# Patient Record
Sex: Female | Born: 1940 | Race: White | Hispanic: No | Smoking: Former smoker
Health system: Southern US, Community
[De-identification: ages and names within clinical notes are randomized; demographics above are authoritative.]

## PROBLEM LIST (undated history)

## (undated) DIAGNOSIS — M545 Low back pain, unspecified: Secondary | ICD-10-CM

## (undated) DIAGNOSIS — E785 Hyperlipidemia, unspecified: Secondary | ICD-10-CM

## (undated) DIAGNOSIS — C801 Malignant (primary) neoplasm, unspecified: Secondary | ICD-10-CM

## (undated) DIAGNOSIS — I10 Essential (primary) hypertension: Secondary | ICD-10-CM

## (undated) DIAGNOSIS — E039 Hypothyroidism, unspecified: Secondary | ICD-10-CM

## (undated) DIAGNOSIS — M1990 Unspecified osteoarthritis, unspecified site: Secondary | ICD-10-CM

## (undated) DIAGNOSIS — G8929 Other chronic pain: Secondary | ICD-10-CM

## (undated) DIAGNOSIS — G459 Transient cerebral ischemic attack, unspecified: Secondary | ICD-10-CM

## (undated) DIAGNOSIS — F341 Dysthymic disorder: Secondary | ICD-10-CM

## (undated) HISTORY — PX: CATARACT EXTRACTION W/ INTRAOCULAR LENS  IMPLANT, BILATERAL: SHX1307

## (undated) HISTORY — DX: Essential (primary) hypertension: I10

## (undated) HISTORY — PX: APPENDECTOMY: SHX54

## (undated) HISTORY — DX: Hyperlipidemia, unspecified: E78.5

## (undated) HISTORY — PX: LEG SURGERY: SHX1003

## (undated) HISTORY — PX: COLOSTOMY: SHX63

## (undated) HISTORY — PX: COLOSTOMY REVERSAL: SHX5782

## (undated) HISTORY — PX: COLON SURGERY: SHX602

## (undated) HISTORY — PX: ABDOMINAL HYSTERECTOMY: SHX81

---

## 2003-04-06 ENCOUNTER — Ambulatory Visit (HOSPITAL_COMMUNITY): Admission: RE | Admit: 2003-04-06 | Discharge: 2003-04-06 | Payer: Self-pay | Admitting: Internal Medicine

## 2003-04-06 ENCOUNTER — Encounter: Payer: Self-pay | Admitting: Internal Medicine

## 2003-05-31 ENCOUNTER — Encounter: Payer: Self-pay | Admitting: Internal Medicine

## 2003-05-31 ENCOUNTER — Ambulatory Visit (HOSPITAL_COMMUNITY): Admission: RE | Admit: 2003-05-31 | Discharge: 2003-05-31 | Payer: Self-pay | Admitting: Internal Medicine

## 2003-06-21 ENCOUNTER — Encounter (HOSPITAL_BASED_OUTPATIENT_CLINIC_OR_DEPARTMENT_OTHER): Payer: Self-pay | Admitting: Internal Medicine

## 2003-06-21 ENCOUNTER — Ambulatory Visit (HOSPITAL_COMMUNITY): Admission: RE | Admit: 2003-06-21 | Discharge: 2003-06-21 | Payer: Self-pay | Admitting: Internal Medicine

## 2004-10-31 ENCOUNTER — Inpatient Hospital Stay: Payer: Self-pay | Admitting: Internal Medicine

## 2005-09-28 ENCOUNTER — Inpatient Hospital Stay: Payer: Self-pay | Admitting: Internal Medicine

## 2005-09-28 ENCOUNTER — Other Ambulatory Visit: Payer: Self-pay

## 2007-02-25 ENCOUNTER — Ambulatory Visit: Payer: Self-pay | Admitting: Ophthalmology

## 2007-03-01 ENCOUNTER — Ambulatory Visit: Payer: Self-pay | Admitting: Ophthalmology

## 2008-10-16 ENCOUNTER — Ambulatory Visit: Payer: Self-pay | Admitting: Pain Medicine

## 2009-08-28 ENCOUNTER — Inpatient Hospital Stay: Payer: Self-pay | Admitting: Internal Medicine

## 2010-01-17 ENCOUNTER — Ambulatory Visit: Payer: Self-pay | Admitting: Internal Medicine

## 2010-01-31 ENCOUNTER — Ambulatory Visit: Payer: Self-pay | Admitting: Internal Medicine

## 2010-02-21 ENCOUNTER — Ambulatory Visit: Payer: Self-pay | Admitting: Internal Medicine

## 2010-03-13 ENCOUNTER — Inpatient Hospital Stay: Payer: Self-pay | Admitting: Internal Medicine

## 2010-04-21 ENCOUNTER — Emergency Department: Payer: Self-pay | Admitting: Emergency Medicine

## 2010-05-03 ENCOUNTER — Ambulatory Visit: Payer: Self-pay | Admitting: Internal Medicine

## 2010-05-06 ENCOUNTER — Ambulatory Visit: Payer: Self-pay | Admitting: Internal Medicine

## 2010-05-10 ENCOUNTER — Ambulatory Visit: Payer: Self-pay | Admitting: Internal Medicine

## 2010-05-17 ENCOUNTER — Ambulatory Visit: Payer: Self-pay | Admitting: Internal Medicine

## 2010-05-23 ENCOUNTER — Ambulatory Visit: Payer: Self-pay | Admitting: Internal Medicine

## 2010-07-05 ENCOUNTER — Ambulatory Visit: Payer: Self-pay | Admitting: Ophthalmology

## 2010-07-18 ENCOUNTER — Ambulatory Visit: Payer: Self-pay | Admitting: Cardiology

## 2010-07-22 ENCOUNTER — Ambulatory Visit: Payer: Self-pay | Admitting: Ophthalmology

## 2010-11-19 ENCOUNTER — Ambulatory Visit: Payer: Self-pay | Admitting: Ophthalmology

## 2011-05-14 ENCOUNTER — Inpatient Hospital Stay: Payer: Self-pay | Admitting: Orthopedic Surgery

## 2011-05-23 ENCOUNTER — Emergency Department: Payer: Self-pay | Admitting: Unknown Physician Specialty

## 2011-07-29 ENCOUNTER — Telehealth: Payer: Self-pay | Admitting: Internal Medicine
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
## 2011-07-29 NOTE — Telephone Encounter (Signed)
Fine to fax over this prescription. May need to hand write it.

## 2011-07-30 ENCOUNTER — Other Ambulatory Visit: Payer: Self-pay | Admitting: Internal Medicine

## 2011-07-31 ENCOUNTER — Other Ambulatory Visit: Payer: Self-pay | Admitting: Internal Medicine

## 2011-07-31 MED ORDER — SIMVASTATIN 40 MG PO TABS: 40.0000 mg | ORAL_TABLET | Freq: Every day | ORAL | Status: DC

## 2011-07-31 NOTE — Telephone Encounter (Signed)
Jeanette Hester @ [facility] pt needs a transfer bench for transferring from power wheel chair to bed and from power wheel chair to tub   Please advise david or fax order [phone]

## 2011-07-31 NOTE — Telephone Encounter (Signed)
Can you please take care of this?

## 2011-08-01 MED ORDER — CYCLOBENZAPRINE HCL 5 MG PO TABS: 5.0000 mg | ORAL_TABLET | Freq: Three times a day (TID) | ORAL | Status: DC | PRN

## 2011-08-01 NOTE — Telephone Encounter (Signed)
Faxed over Rx

## 2011-08-04 NOTE — Telephone Encounter (Signed)
Opened in error

## 2011-08-05 NOTE — Telephone Encounter (Signed)
Pam, Can you fax over any additional Rx they need? Thanks

## 2011-08-05 NOTE — Telephone Encounter (Signed)
Telephone Encounter     Jeanette Hester @ [facility] pt needs a transfer bench for transferring from power wheel chair to bed and from power wheel chair to tub  Please advise david or fax order [phone]

## 2011-08-08 MED ORDER — TRANSFER BENCH MISC: Status: AC

## 2011-08-08 NOTE — Telephone Encounter (Signed)
RX sent in

## 2011-08-21 ENCOUNTER — Ambulatory Visit (INDEPENDENT_AMBULATORY_CARE_PROVIDER_SITE_OTHER): Payer: Medicare Other | Admitting: Internal Medicine

## 2011-08-21 ENCOUNTER — Encounter: Payer: Self-pay | Admitting: Internal Medicine

## 2011-08-21 DIAGNOSIS — I10 Essential (primary) hypertension: Secondary | ICD-10-CM

## 2011-08-21 DIAGNOSIS — E039 Hypothyroidism, unspecified: Secondary | ICD-10-CM | POA: Insufficient documentation

## 2011-08-21 DIAGNOSIS — G8921 Chronic pain due to trauma: Secondary | ICD-10-CM

## 2011-08-21 DIAGNOSIS — E119 Type 2 diabetes mellitus without complications: Secondary | ICD-10-CM | POA: Insufficient documentation

## 2011-08-21 DIAGNOSIS — E785 Hyperlipidemia, unspecified: Secondary | ICD-10-CM | POA: Insufficient documentation

## 2011-08-21 DIAGNOSIS — G4730 Sleep apnea, unspecified: Secondary | ICD-10-CM

## 2011-08-21 DIAGNOSIS — Z23 Encounter for immunization: Secondary | ICD-10-CM

## 2011-08-21 MED ORDER — INSULIN GLARGINE 100 UNIT/ML ~~LOC~~ SOLN: 63.0000 [IU] | Freq: Every day | SUBCUTANEOUS | Status: DC

## 2011-08-21 MED ORDER — FENTANYL 50 MCG/HR TD PT72: 1.0000 | MEDICATED_PATCH | TRANSDERMAL | Status: AC

## 2011-08-21 MED ORDER — OXYCODONE HCL 10 MG PO TABS: 1.0000 | ORAL_TABLET | ORAL | Status: AC | PRN
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
## 2011-08-21 NOTE — Patient Instructions (Addendum)
We will check labs today.  Try to cut back on Oxycodone use to only as needed, no more than 1-3 times per day. Follow up in 1 month.

## 2011-08-22 LAB — COMPREHENSIVE METABOLIC PANEL
ALT: 21 U/L (ref 0–35)
Albumin: 3.6 g/dL (ref 3.5–5.2)
CO2: 31 mEq/L (ref 19–32)
Calcium: 8.7 mg/dL (ref 8.4–10.5)
Chloride: 97 mEq/L (ref 96–112)
Creatinine, Ser: 1.5 mg/dL — ABNORMAL HIGH (ref 0.4–1.2)
GFR: 37.34 mL/min — ABNORMAL LOW (ref >60.00)
Potassium: 4.2 mEq/L (ref 3.5–5.1)
Total Protein: 6.7 g/dL (ref 6.0–8.3)

## 2011-08-22 LAB — CBC WITH DIFFERENTIAL/PLATELET
Basophils Absolute: 0 10*3/uL (ref 0.0–0.1)
Eosinophils Absolute: 0 10*3/uL (ref 0.0–0.7)
Hemoglobin: 10.9 g/dL — ABNORMAL LOW (ref 12.0–15.0)
Lymphocytes Relative: 25.2 % (ref 12.0–46.0)
Monocytes Relative: 7.2 % (ref 3.0–12.0)
Neutrophils Relative %: 67.3 % (ref 43.0–77.0)
Platelets: 187 10*3/uL (ref 150.0–400.0)
RDW: 14.9 % — ABNORMAL HIGH (ref 11.5–14.6)

## 2011-08-22 LAB — TSH: TSH: 1.3 u[IU]/mL (ref 0.35–5.50)

## 2011-08-22 LAB — HEMOGLOBIN A1C: Hgb A1c MFr Bld: 7.5 % — ABNORMAL HIGH (ref 4.6–6.5)

## 2011-08-27 ENCOUNTER — Telehealth: Payer: Self-pay | Admitting: Internal Medicine

## 2011-08-27 NOTE — Telephone Encounter (Signed)
She had a decline in her kidney function on labs, so probably not safe to use NSAIDS on a regular basis. She can add tylenol as needed (max 2gm per day) and we can try a long acting narcotic if necessary.  She seemed a little drowsy at her visit, though, so would prefer to see her back prior to starting long acting narcotic.

## 2011-08-27 NOTE — Telephone Encounter (Signed)
Patient is in pain all over her body. Does not know what is wrong. She had a refill on pain medication on Nov.1 ,pain medication not working. She would like a call to advise her as to what is going on with her body.

## 2011-08-27 NOTE — Telephone Encounter (Signed)
Spoke with pt - She has gotten no relief from oxycodone. She had one pill of naprelan 750 mg which helped significantly with her pain. Can she have RX for this? Or something similar? Or try OTC aleve?

## 2011-08-28 ENCOUNTER — Observation Stay: Payer: Self-pay | Admitting: Internal Medicine

## 2011-08-28 NOTE — Telephone Encounter (Signed)
No answer, no VM

## 2011-08-29 NOTE — Telephone Encounter (Signed)
Patient went to the ER and kept overnight, she had diarrhea and low blood sugar. She is feeling better now and will keep apt on 11/19 for f/u.

## 2011-09-08 ENCOUNTER — Ambulatory Visit: Payer: Medicare Other | Admitting: Internal Medicine

## 2011-09-10 ENCOUNTER — Telehealth: Payer: Self-pay | Admitting: *Deleted

## 2011-09-10 NOTE — Telephone Encounter (Signed)
Onalee Hua with [facility] is calling in regards to an order he was supposed to receive for patient to restart her cpap. He says that this was supposed to be done at patient's last visit on 08-21-11 and is asking if he can get this order faxed to [phone]. He is aware that you are out of the office until Monday.

## 2011-09-14 NOTE — Telephone Encounter (Signed)
Fine to fax order to renew CPAP.

## 2011-09-16 ENCOUNTER — Other Ambulatory Visit: Payer: Self-pay | Admitting: Internal Medicine

## 2011-09-16 ENCOUNTER — Encounter: Payer: Self-pay | Admitting: *Deleted

## 2011-09-16 NOTE — Telephone Encounter (Signed)
Order faxed to [facility].

## 2011-09-17 ENCOUNTER — Ambulatory Visit (INDEPENDENT_AMBULATORY_CARE_PROVIDER_SITE_OTHER): Payer: Medicare Other | Admitting: Internal Medicine

## 2011-09-17 ENCOUNTER — Encounter: Payer: Self-pay | Admitting: Internal Medicine

## 2011-09-17 DIAGNOSIS — R609 Edema, unspecified: Secondary | ICD-10-CM

## 2011-09-17 DIAGNOSIS — A6000 Herpesviral infection of urogenital system, unspecified: Secondary | ICD-10-CM

## 2011-09-17 DIAGNOSIS — R300 Dysuria: Secondary | ICD-10-CM

## 2011-09-17 DIAGNOSIS — G8921 Chronic pain due to trauma: Secondary | ICD-10-CM

## 2011-09-17 DIAGNOSIS — E119 Type 2 diabetes mellitus without complications: Secondary | ICD-10-CM

## 2011-09-17 MED ORDER — FUROSEMIDE 80 MG PO TABS: 80.0000 mg | ORAL_TABLET | Freq: Every day | ORAL | Status: DC

## 2011-09-17 MED ORDER — CEPHALEXIN 250 MG PO CAPS: 500.0000 mg | ORAL_CAPSULE | Freq: Two times a day (BID) | ORAL | Status: AC

## 2011-09-17 MED ORDER — INSULIN GLARGINE 100 UNIT/ML ~~LOC~~ SOLN: 60.0000 [IU] | Freq: Every day | SUBCUTANEOUS | Status: DC

## 2011-09-17 MED ORDER — VALACYCLOVIR HCL 500 MG PO TABS: 500.0000 mg | ORAL_TABLET | Freq: Every day | ORAL | Status: DC

## 2011-09-17 NOTE — Progress Notes (Signed)
Subjective:    Patient ID: Jeanette Hester, female    DOB: [birth year], 69 y.o.   MRN: [id number]  HPI 70YO female with h/o DM and recent fracture of left femur presents for follow up. Was recently hospitalized overnight in the ED for dehydration.  In regards to DM, still having some low BG. Notes that she often does not appreciate symptoms, just blacks out. Has called EMS 4 times in last 2 weeks.  Still taking Lantus 63 units at bedtime, which was dose she was discharged from rehab on.  Record of BG shows sugars 90 to 300 mostly.    Notes that leg pain is controlled with fentanyl and oxycodone. Notes that wound left foot scheduled for skin grafting next week.  Pt notes that she was treated for UTI after ED stay. Was treated with keflex.  Notes persistent dysuria after completing Keflex. No fever, chills, flank pain.    Outpatient Encounter Prescriptions as of 09/17/2011  Medication Sig Dispense Refill  . ALPRAZolam (XANAX) 0.5 MG tablet Take 0.5 mg by mouth at bedtime as needed.        Marland Kitchen amLODipine (NORVASC) 5 MG tablet Take 5 mg by mouth daily.        . Choline Fenofibrate 135 MG capsule Take 135 mg by mouth daily.        . clopidogrel (PLAVIX) 75 MG tablet Take 75 mg by mouth daily.        . cyclobenzaprine (FLEXERIL) 5 MG tablet Take 1 tablet (5 mg total) by mouth every 8 (eight) hours as needed for muscle spasms.  30 tablet  1  . fenofibrate (TRICOR) 145 MG tablet Take 145 mg by mouth daily.        . fentaNYL (DURAGESIC - DOSED MCG/HR) 50 MCG/HR Place 1 patch (50 mcg total) onto the skin every 3 (three) days.  5 patch  0  . furosemide (LASIX) 80 MG tablet Take 1 tablet (80 mg total) by mouth daily. 1/2 tablet daily   30 tablet  0  . gabapentin (NEURONTIN) 300 MG capsule Take 300 mg by mouth 4 (four) times daily.        . insulin glargine (LANTUS) 100 UNIT/ML injection Inject 60 Units into the skin at bedtime.  10 mL  12  . levothyroxine (SYNTHROID, LEVOTHROID) 112 MCG tablet Take 112 mcg by  mouth daily.        . metFORMIN (GLUCOPHAGE) 500 MG tablet Take 500 mg by mouth daily as needed.        . mirtazapine (REMERON) 15 MG tablet Take 15 mg by mouth at bedtime.        . Misc. Devices (TRANSFER BENCH) MISC As directed DX: DJD, pelvic fracture  1 each  0  . Oxycodone HCl 10 MG TABS Take 1 tablet (10 mg total) by mouth every 4 (four) hours as needed.  90 each  0  . pantoprazole (PROTONIX) 40 MG tablet Take 40 mg by mouth daily.        . potassium chloride (KLOR-CON) 10 MEQ CR tablet Take 10 mEq by mouth daily.        . simvastatin (ZOCOR) 40 MG tablet Take 1 tablet (40 mg total) by mouth at bedtime.  30 tablet  3  . valACYclovir (VALTREX) 500 MG tablet Take 500 mg by mouth daily.        Marland Kitchen venlafaxine (EFFEXOR-XR) 150 MG 24 hr capsule Take 150 mg by mouth daily.        Marland Kitchen  zolpidem (AMBIEN) 10 MG tablet Take 10 mg by mouth at bedtime as needed.          Review of Systems  Constitutional: Negative for fever, chills, appetite change, fatigue and unexpected weight change.  HENT: Negative for ear pain, congestion, sore throat, trouble swallowing, neck pain, voice change and sinus pressure.   Eyes: Negative for visual disturbance.  Respiratory: Negative for cough, shortness of breath, wheezing and stridor.   Cardiovascular: Negative for chest pain, palpitations and leg swelling.  Gastrointestinal: Negative for nausea, vomiting, abdominal pain, diarrhea, constipation, blood in stool, abdominal distention and anal bleeding.  Genitourinary: Negative for dysuria and flank pain.  Musculoskeletal: Positive for myalgias, back pain and arthralgias. Negative for gait problem.  Skin: Negative for color change and rash.  Neurological: Negative for dizziness and headaches.  Hematological: Negative for adenopathy. Does not bruise/bleed easily.  Psychiatric/Behavioral: Negative for suicidal ideas, sleep disturbance and dysphoric mood. The patient is not nervous/anxious.    BP 138/60  Pulse 62  Temp  98.4 F (36.9 C)  Resp 16  SpO2 99%     Objective:   Physical Exam  Constitutional: She is oriented to person, place, and time. She appears well-developed and well-nourished. No distress.  HENT:  Head: Normocephalic and atraumatic.  Right Ear: External ear normal.  Left Ear: External ear normal.  Nose: Nose normal.  Mouth/Throat: Oropharynx is clear and moist. No oropharyngeal exudate.  Eyes: Conjunctivae are normal. Pupils are equal, round, and reactive to light. Right eye exhibits no discharge. Left eye exhibits no discharge. No scleral icterus.  Neck: Normal range of motion. Neck supple. No tracheal deviation present. No thyromegaly present.  Cardiovascular: Normal rate, regular rhythm, normal heart sounds and intact distal pulses.  Exam reveals no gallop and no friction rub.   No murmur heard. Pulmonary/Chest: Effort normal and breath sounds normal. No respiratory distress. She has no wheezes. She has no rales. She exhibits no tenderness.  Musculoskeletal: Normal range of motion. She exhibits no edema and no tenderness.  Lymphadenopathy:    She has no cervical adenopathy.  Neurological: She is alert and oriented to person, place, and time. No cranial nerve deficit. She exhibits normal muscle tone. Coordination normal.  Skin: Skin is warm and dry. No rash noted. She is not diaphoretic. No erythema. No pallor.  Psychiatric: She has a normal mood and affect. Her behavior is normal. Judgment and thought content normal.          Assessment & Plan:  1. Diabetes mellitus - BG low, with frequent blackouts. Has required EMS x 4 since last visit. I would like to significantly reduce her dose of Lantus, but she is reluctant to do this because of hyperglycemia in past. Will decrease to 60units daily. If any further low BG<80, she will call and we will reduce further.  I think she would benefit from care in assisted living. She would like to think about this over next few weeks. Follow up in  6 weeks.  2. Chronic pain after traumatic injury - Controlled with current medications. No changes made today.  3. UTI - Pt was treated with Keflex after ED visit. Still having some dysuria. Will repeat UA and culture. She is unable to give specimen today, so will have her bring one from home. Will continue Keflex for another week.

## 2011-09-18 ENCOUNTER — Other Ambulatory Visit: Payer: Self-pay | Admitting: *Deleted

## 2011-09-18 DIAGNOSIS — R609 Edema, unspecified: Secondary | ICD-10-CM

## 2011-09-19 ENCOUNTER — Other Ambulatory Visit: Payer: Self-pay | Admitting: *Deleted

## 2011-09-19 DIAGNOSIS — R609 Edema, unspecified: Secondary | ICD-10-CM

## 2011-09-19 MED ORDER — FUROSEMIDE 80 MG PO TABS: 40.0000 mg | ORAL_TABLET | Freq: Every day | ORAL | Status: DC

## 2011-09-25 ENCOUNTER — Ambulatory Visit: Payer: Medicare Other | Admitting: Internal Medicine

## 2011-10-06 ENCOUNTER — Emergency Department: Payer: Self-pay | Admitting: Emergency Medicine

## 2011-10-16 ENCOUNTER — Telehealth: Payer: Self-pay | Admitting: Internal Medicine

## 2011-10-16 DIAGNOSIS — A6000 Herpesviral infection of urogenital system, unspecified: Secondary | ICD-10-CM

## 2011-10-16 NOTE — Telephone Encounter (Signed)
Fine to take 2x per day for 10 days, then go back to once daily.

## 2011-10-16 NOTE — Telephone Encounter (Signed)
Patient wanted you to know that her son passed away the first part of 10/03/2023 and she states she has had herpies for a long time and she takes [employer] hcl 500 mg, she states she talked with the pharmacy and they state she should get a prescription for a 10 day 2 500 mg valacyclobir  And then a preventive medication.  She uses [facility].

## 2011-10-16 NOTE — Telephone Encounter (Signed)
OK for RF? If she is having outbreak should she take med bid x 10 days then back to once a day?

## 2011-10-17 MED ORDER — VALACYCLOVIR HCL 500 MG PO TABS: 500.0000 mg | ORAL_TABLET | Freq: Every day | ORAL | Status: DC

## 2011-10-17 NOTE — Telephone Encounter (Signed)
Done. Patient informed

## 2011-10-30 ENCOUNTER — Telehealth: Payer: Self-pay | Admitting: *Deleted

## 2011-10-30 NOTE — Telephone Encounter (Signed)
We can also give her a stool collection kit to check for CDiff and stool culture.

## 2011-10-30 NOTE — Telephone Encounter (Signed)
Patient informed, she will try to pickup specimen collection kit (or have someone pick it up). She also would like urine hat and cup to check u/a. Will add hat & cup to stool kit.

## 2011-10-30 NOTE — Telephone Encounter (Signed)
Pt c/o frequent loose stools since treatment (x 2) w/abx. No nausea or vomiting. I advised bland diet and scheduled OV for next week for eval.

## 2011-11-04 ENCOUNTER — Ambulatory Visit: Payer: Medicare Other | Admitting: Internal Medicine

## 2011-11-13 ENCOUNTER — Ambulatory Visit (INDEPENDENT_AMBULATORY_CARE_PROVIDER_SITE_OTHER): Payer: Medicare Other | Admitting: Internal Medicine

## 2011-11-13 ENCOUNTER — Encounter: Payer: Self-pay | Admitting: Internal Medicine

## 2011-11-13 DIAGNOSIS — R3915 Urgency of urination: Secondary | ICD-10-CM

## 2011-11-13 DIAGNOSIS — G629 Polyneuropathy, unspecified: Secondary | ICD-10-CM

## 2011-11-13 DIAGNOSIS — G4730 Sleep apnea, unspecified: Secondary | ICD-10-CM

## 2011-11-13 DIAGNOSIS — F329 Major depressive disorder, single episode, unspecified: Secondary | ICD-10-CM

## 2011-11-13 DIAGNOSIS — R61 Generalized hyperhidrosis: Secondary | ICD-10-CM

## 2011-11-13 DIAGNOSIS — E119 Type 2 diabetes mellitus without complications: Secondary | ICD-10-CM

## 2011-11-13 DIAGNOSIS — IMO0001 Reserved for inherently not codable concepts without codable children: Secondary | ICD-10-CM

## 2011-11-13 DIAGNOSIS — R197 Diarrhea, unspecified: Secondary | ICD-10-CM

## 2011-11-13 DIAGNOSIS — E785 Hyperlipidemia, unspecified: Secondary | ICD-10-CM

## 2011-11-13 DIAGNOSIS — E039 Hypothyroidism, unspecified: Secondary | ICD-10-CM

## 2011-11-13 LAB — CBC WITH DIFFERENTIAL/PLATELET
Basophils Relative: 0.8 % (ref 0.0–3.0)
Eosinophils Relative: 0.1 % (ref 0.0–5.0)
HCT: 32.9 % — ABNORMAL LOW (ref 36.0–46.0)
Hemoglobin: 11.1 g/dL — ABNORMAL LOW (ref 12.0–15.0)
Lymphs Abs: 0.9 10*3/uL (ref 0.7–4.0)
Monocytes Relative: 7.7 % (ref 3.0–12.0)
Neutro Abs: 3.7 10*3/uL (ref 1.4–7.7)
Platelets: 147 10*3/uL — ABNORMAL LOW (ref 150.0–400.0)
RBC: 3.81 Mil/uL — ABNORMAL LOW (ref 3.87–5.11)
WBC: 5 10*3/uL (ref 4.5–10.5)

## 2011-11-13 LAB — TSH: TSH: 1.27 u[IU]/mL (ref 0.35–5.50)

## 2011-11-13 LAB — GLUCOSE, POCT (MANUAL RESULT ENTRY): POC Glucose: 257

## 2011-11-13 LAB — COMPREHENSIVE METABOLIC PANEL
AST: 28 U/L (ref 0–37)
Alkaline Phosphatase: 64 U/L (ref 39–117)
BUN: 38 mg/dL — ABNORMAL HIGH (ref 6–23)
Creatinine, Ser: 1.5 mg/dL — ABNORMAL HIGH (ref 0.4–1.2)
Total Bilirubin: 0.7 mg/dL (ref 0.3–1.2)

## 2011-11-13 MED ORDER — LEVOTHYROXINE SODIUM 112 MCG PO TABS: 112.0000 ug | ORAL_TABLET | Freq: Every day | ORAL | Status: DC

## 2011-11-13 MED ORDER — CHOLINE FENOFIBRATE 135 MG PO CPDR: 135.0000 mg | DELAYED_RELEASE_CAPSULE | Freq: Every day | ORAL | Status: DC

## 2011-11-13 MED ORDER — MIRTAZAPINE 15 MG PO TABS: 15.0000 mg | ORAL_TABLET | Freq: Every day | ORAL | Status: DC

## 2011-11-13 MED ORDER — GABAPENTIN 300 MG PO CAPS: 300.0000 mg | ORAL_CAPSULE | Freq: Four times a day (QID) | ORAL | Status: DC

## 2011-11-13 NOTE — Assessment & Plan Note (Signed)
Blood sugars have been low recently in the setting of poor by mouth intake and diarrhea. We'll reduce Lantus dose to 30 units daily while patient is experiencing diarrhea and poor by mouth intake. She will continue to monitor her blood sugars, his blood sugars rising greater than 200, she will call our office. Follow up 2 weeks.

## 2011-11-13 NOTE — Assessment & Plan Note (Signed)
Patient with several week history of watery diarrhea. Symptoms are concerning for infectious etiology. Will send stool for culture and C. difficile testing. Will check CMP with labs today to evaluate for dehydration. Encouraged patient to increase fluid intake. If stool cultures negative, and diarrhea persists, would favor getting GI evaluation and possible colonoscopy. Followup in 2 weeks.

## 2011-11-13 NOTE — Progress Notes (Signed)
Subjective:    Patient ID: Jeanette Hester, female    DOB: 1941-03-08, 71 y.o.   MRN: [id number]  HPI 71 year old female with history of diabetes, hypertension, and severe degenerative osteoarthritis presents for acute visit complaining of a several week history of watery diarrhea. Over the last several weeks, she has noted 5-10 episodes per day of watery diarrhea. She denies any blood in her stool. She denies any abdominal pain. She has not had fever or chills. She has not had any nausea or vomiting. She has taken Imodium with minimal improvement in her symptoms. She has tried increasing her fluid intake.  She notes that her blood sugars during this time have been low with frequent blood sugars below 50. She is symptomatic with diaphoresis with her low blood sugars. She corrects her blood sugars by eating. She reports compliance with her Lantus insulin.  Outpatient Encounter Prescriptions as of 11/13/2011  Medication Sig Dispense Refill  . ALPRAZolam (XANAX) 0.5 MG tablet Take 0.5 mg by mouth at bedtime as needed.        Marland Kitchen amLODipine (NORVASC) 5 MG tablet Take 5 mg by mouth daily.        . Choline Fenofibrate 135 MG capsule Take 135 mg by mouth daily.        . clopidogrel (PLAVIX) 75 MG tablet Take 75 mg by mouth daily.        . cyclobenzaprine (FLEXERIL) 5 MG tablet Take 1 tablet (5 mg total) by mouth every 8 (eight) hours as needed for muscle spasms.  30 tablet  1  . fenofibrate (TRICOR) 145 MG tablet Take 145 mg by mouth daily.        . fentaNYL (DURAGESIC - DOSED MCG/HR) 50 MCG/HR Place 1 patch (50 mcg total) onto the skin every 3 (three) days.  5 patch  0  . furosemide (LASIX) 80 MG tablet Take 0.5 tablets (40 mg total) by mouth daily.  45 tablet  1  . gabapentin (NEURONTIN) 300 MG capsule Take 1 capsule (300 mg total) by mouth 4 (four) times daily.  120 capsule  3  . insulin glargine (LANTUS) 100 UNIT/ML injection Inject 63 Units into the skin at bedtime.      Marland Kitchen levothyroxine (SYNTHROID,  LEVOTHROID) 112 MCG tablet Take 1 tablet (112 mcg total) by mouth daily.  90 tablet  1  . metFORMIN (GLUCOPHAGE) 500 MG tablet Take 500 mg by mouth daily as needed.        . mirtazapine (REMERON) 15 MG tablet Take 1 tablet (15 mg total) by mouth at bedtime.  90 tablet  1  . Misc. Devices (TRANSFER BENCH) MISC As directed DX: DJD, pelvic fracture  1 each  0  . Oxycodone HCl 10 MG TABS Take 1 tablet (10 mg total) by mouth every 4 (four) hours as needed.  90 each  0  . pantoprazole (PROTONIX) 40 MG tablet Take 40 mg by mouth daily.        . potassium chloride (KLOR-CON) 10 MEQ CR tablet Take 10 mEq by mouth daily.        . simvastatin (ZOCOR) 40 MG tablet Take 1 tablet (40 mg total) by mouth at bedtime.  30 tablet  3  . valACYclovir (VALTREX) 500 MG tablet Take 1 tablet (500 mg total) by mouth daily. May take bid prn outbreak then resume qd dosing  60 tablet  3  . venlafaxine (EFFEXOR-XR) 150 MG 24 hr capsule Take 150 mg by mouth daily.        Marland Kitchen  zolpidem (AMBIEN) 10 MG tablet Take 10 mg by mouth at bedtime as needed.        . Choline Fenofibrate 135 MG capsule Take 1 capsule (135 mg total) by mouth daily.  90 capsule  1     Review of Systems  Constitutional: Negative for fever, chills, appetite change, fatigue and unexpected weight change.  HENT: Negative for ear pain, congestion, sore throat, trouble swallowing, neck pain, voice change and sinus pressure.   Eyes: Negative for visual disturbance.  Respiratory: Negative for cough, shortness of breath, wheezing and stridor.   Cardiovascular: Negative for chest pain, palpitations and leg swelling.  Gastrointestinal: Positive for diarrhea. Negative for nausea, vomiting, abdominal pain, constipation, blood in stool, abdominal distention and anal bleeding.  Genitourinary: Negative for dysuria and flank pain.  Musculoskeletal: Negative for myalgias, arthralgias and gait problem.  Skin: Negative for color change and rash.  Neurological: Positive for  weakness. Negative for dizziness and headaches.  Hematological: Negative for adenopathy. Does not bruise/bleed easily.  Psychiatric/Behavioral: Negative for suicidal ideas, sleep disturbance and dysphoric mood. The patient is not nervous/anxious.    BP 102/52  Pulse 59  Temp(Src) 98.3 F (36.8 C) (Oral)  SpO2 97%     Objective:   Physical Exam  Constitutional: She is oriented to person, place, and time. She appears well-developed and well-nourished. No distress.       Obese, wheel-chair bound  HENT:  Head: Normocephalic and atraumatic.  Right Ear: External ear normal.  Left Ear: External ear normal.  Nose: Nose normal.  Mouth/Throat: Oropharynx is clear and moist. No oropharyngeal exudate.  Eyes: Conjunctivae are normal. Pupils are equal, round, and reactive to light. Right eye exhibits no discharge. Left eye exhibits no discharge. No scleral icterus.  Neck: Normal range of motion. Neck supple. No tracheal deviation present. No thyromegaly present.  Cardiovascular: Normal rate, regular rhythm, normal heart sounds and intact distal pulses.  Exam reveals no gallop and no friction rub.   No murmur heard. Pulmonary/Chest: Effort normal and breath sounds normal. No respiratory distress. She has no wheezes. She has no rales. She exhibits no tenderness.  Abdominal: Soft. Bowel sounds are normal. She exhibits no distension. There is no tenderness.  Musculoskeletal: Normal range of motion. She exhibits no edema and no tenderness.  Lymphadenopathy:    She has no cervical adenopathy.  Neurological: She is alert and oriented to person, place, and time. No cranial nerve deficit. She exhibits normal muscle tone. Coordination normal.  Skin: Skin is warm and dry. No rash noted. She is not diaphoretic. No erythema. No pallor.  Psychiatric: She has a normal mood and affect. Her behavior is normal. Judgment and thought content normal.          Assessment & Plan:

## 2011-11-13 NOTE — Patient Instructions (Signed)
Please reduce dose of Lantus to 30units daily.  Call if any blood sugars >250 or less than 80.  Please bring stool culture and urine culture samples in.

## 2011-12-01 ENCOUNTER — Telehealth: Payer: Self-pay | Admitting: *Deleted

## 2011-12-01 NOTE — Telephone Encounter (Signed)
I spoke w/[facility] RN - Pt fell at home due to low blood sugar. She has some bruising pain especially in her right foot. I offered pt multiple apt's - she has transportation issues and is scheduled for Friday. I informed the nurse that pt should go to the ER w/severe pain or further episodes of low sugar.

## 2011-12-05 ENCOUNTER — Ambulatory Visit (INDEPENDENT_AMBULATORY_CARE_PROVIDER_SITE_OTHER): Payer: Medicare Other | Admitting: Internal Medicine

## 2011-12-05 ENCOUNTER — Encounter: Payer: Self-pay | Admitting: Internal Medicine

## 2011-12-05 VITALS — BP 120/82 | HR 61 | Temp 98.6°F | Resp 12

## 2011-12-05 DIAGNOSIS — E1165 Type 2 diabetes mellitus with hyperglycemia: Secondary | ICD-10-CM

## 2011-12-05 DIAGNOSIS — E039 Hypothyroidism, unspecified: Secondary | ICD-10-CM

## 2011-12-05 DIAGNOSIS — E162 Hypoglycemia, unspecified: Secondary | ICD-10-CM

## 2011-12-05 LAB — POCT CBG (FASTING - GLUCOSE)-MANUAL ENTRY: Glucose Fasting, POC: 242 mg/dL — AB (ref 70–99)

## 2011-12-05 MED ORDER — INSULIN GLARGINE 100 UNIT/ML ~~LOC~~ SOLN: 30.0000 [IU] | Freq: Every day | SUBCUTANEOUS | Status: DC

## 2011-12-05 MED ORDER — LEVOTHYROXINE SODIUM 112 MCG PO TABS: 112.0000 ug | ORAL_TABLET | Freq: Every day | ORAL | Status: DC

## 2011-12-05 NOTE — Progress Notes (Signed)
Subjective:    Patient ID: Jeanette Hester, female    DOB: 07/19/1941, 71 y.o.   MRN: [id number]  HPI 71 year old female with degenerative joint disease who is wheelchair-bound and poorly controlled diabetes mellitus presents for acute visit. Earlier this week, she had an episode during which she became unresponsive and estimates that she blacked out for over 3 hours. When she woke she was able to call EMS and they checked her blood sugar finding it to be 45. This has happened on several occasions. She has widely variable blood sugars ranging from below 40 up to over 500. She currently lives alone and has had several episodes where she wakes up after blacking out secondary to low blood sugars. She reports compliance with her medication, Lantus 50 units daily. The dose of her Lantus was reduced at her last visit with no improvement in hypoglycemic episodes. Aside from this, she reports feeling well. She denies any recent illness. She denies any fever, chills, or other symptoms.  Outpatient Encounter Prescriptions as of 12/05/2011  Medication Sig Dispense Refill  . ALPRAZolam (XANAX) 0.5 MG tablet Take 0.5 mg by mouth at bedtime as needed.        Marland Kitchen amLODipine (NORVASC) 5 MG tablet Take 5 mg by mouth daily.        . Choline Fenofibrate 135 MG capsule Take 135 mg by mouth daily.        . Choline Fenofibrate 135 MG capsule Take 1 capsule (135 mg total) by mouth daily.  90 capsule  1  . clopidogrel (PLAVIX) 75 MG tablet Take 75 mg by mouth daily.        . cyclobenzaprine (FLEXERIL) 5 MG tablet Take 1 tablet (5 mg total) by mouth every 8 (eight) hours as needed for muscle spasms.  30 tablet  1  . fenofibrate (TRICOR) 145 MG tablet Take 145 mg by mouth daily.        . fentaNYL (DURAGESIC - DOSED MCG/HR) 50 MCG/HR Place 1 patch (50 mcg total) onto the skin every 3 (three) days.  5 patch  0  . furosemide (LASIX) 80 MG tablet Take 0.5 tablets (40 mg total) by mouth daily.  45 tablet  1  . gabapentin (NEURONTIN)  300 MG capsule Take 1 capsule (300 mg total) by mouth 4 (four) times daily.  120 capsule  3  . insulin glargine (LANTUS) 100 UNIT/ML injection Inject 30 Units into the skin at bedtime.  10 mL  3  . levothyroxine (SYNTHROID, LEVOTHROID) 112 MCG tablet Take 1 tablet (112 mcg total) by mouth daily.  90 tablet  1  . metFORMIN (GLUCOPHAGE) 500 MG tablet Take 500 mg by mouth daily as needed.        . mirtazapine (REMERON) 15 MG tablet Take 1 tablet (15 mg total) by mouth at bedtime.  90 tablet  1  . Misc. Devices (TRANSFER BENCH) MISC As directed DX: DJD, pelvic fracture  1 each  0  . Oxycodone HCl 10 MG TABS Take 1 tablet (10 mg total) by mouth every 4 (four) hours as needed.  90 each  0  . pantoprazole (PROTONIX) 40 MG tablet Take 40 mg by mouth daily.        . potassium chloride (KLOR-CON) 10 MEQ CR tablet Take 10 mEq by mouth daily.        . simvastatin (ZOCOR) 40 MG tablet Take 1 tablet (40 mg total) by mouth at bedtime.  30 tablet  3  . valACYclovir (VALTREX) 500  MG tablet Take 1 tablet (500 mg total) by mouth daily. May take bid prn outbreak then resume qd dosing  60 tablet  3  . venlafaxine (EFFEXOR-XR) 150 MG 24 hr capsule Take 150 mg by mouth daily.        Marland Kitchen zolpidem (AMBIEN) 10 MG tablet Take 10 mg by mouth at bedtime as needed.          Review of Systems  Constitutional: Negative for fever, chills, appetite change, fatigue and unexpected weight change.  HENT: Negative for ear pain, congestion, sore throat, trouble swallowing, neck pain, voice change and sinus pressure.   Eyes: Negative for visual disturbance.  Respiratory: Negative for cough, shortness of breath, wheezing and stridor.   Cardiovascular: Negative for chest pain, palpitations and leg swelling.  Gastrointestinal: Negative for nausea, vomiting, abdominal pain, diarrhea, constipation, blood in stool, abdominal distention and anal bleeding.  Genitourinary: Negative for dysuria and flank pain.  Musculoskeletal: Positive for  arthralgias. Negative for myalgias and gait problem.  Skin: Negative for color change and rash.  Neurological: Positive for dizziness and weakness. Negative for headaches.  Hematological: Negative for adenopathy. Does not bruise/bleed easily.  Psychiatric/Behavioral: Negative for suicidal ideas, sleep disturbance and dysphoric mood. The patient is not nervous/anxious.    BP 120/82  Pulse 61  Temp(Src) 98.6 F (37 C) (Oral)  Resp 12  SpO2 98%     Objective:   Physical Exam  Constitutional: She is oriented to person, place, and time. She appears well-developed and well-nourished. No distress.       Obese, sitting in wheelchair NAD  HENT:  Head: Normocephalic and atraumatic.  Right Ear: External ear normal.  Left Ear: External ear normal.  Nose: Nose normal.  Mouth/Throat: Oropharynx is clear and moist. No oropharyngeal exudate.  Eyes: Conjunctivae are normal. Pupils are equal, round, and reactive to light. Right eye exhibits no discharge. Left eye exhibits no discharge. No scleral icterus.  Neck: Normal range of motion. Neck supple. No tracheal deviation present. No thyromegaly present.  Cardiovascular: Normal rate, regular rhythm, normal heart sounds and intact distal pulses.  Exam reveals no gallop and no friction rub.   No murmur heard. Pulmonary/Chest: Effort normal and breath sounds normal. No respiratory distress. She has no wheezes. She has no rales. She exhibits no tenderness.  Musculoskeletal: Normal range of motion. She exhibits no edema and no tenderness.  Lymphadenopathy:    She has no cervical adenopathy.  Neurological: She is alert and oriented to person, place, and time. No cranial nerve deficit. She exhibits normal muscle tone. Coordination normal.  Skin: Skin is warm and dry. No rash noted. She is not diaphoretic. No erythema. No pallor.  Psychiatric: She has a normal mood and affect. Her behavior is normal. Judgment and thought content normal.            Assessment & Plan:

## 2011-12-05 NOTE — Assessment & Plan Note (Signed)
Patient with widely variable blood sugars. Currently lives alone. Has had several episodes where she has been unresponsive secondary to hypoglycemia. Strongly encouraged her to consider moving to an assisted living facility where she can be better monitored. Will set up home health to more closely monitor her blood sugar. Will decrease Lantus dose to 30 units daily. Followup in one month.

## 2011-12-05 NOTE — Patient Instructions (Signed)
Please reduce your dose of Lantus to 30units daily.  We will set up home health nurse to monitor blood sugar.  Follow up 2 weeks.

## 2011-12-10 DIAGNOSIS — L8992 Pressure ulcer of unspecified site, stage 2: Secondary | ICD-10-CM

## 2011-12-10 DIAGNOSIS — E1149 Type 2 diabetes mellitus with other diabetic neurological complication: Secondary | ICD-10-CM

## 2011-12-10 DIAGNOSIS — L89609 Pressure ulcer of unspecified heel, unspecified stage: Secondary | ICD-10-CM

## 2011-12-17 DIAGNOSIS — E1149 Type 2 diabetes mellitus with other diabetic neurological complication: Secondary | ICD-10-CM

## 2011-12-17 DIAGNOSIS — L89609 Pressure ulcer of unspecified heel, unspecified stage: Secondary | ICD-10-CM

## 2011-12-17 DIAGNOSIS — L8992 Pressure ulcer of unspecified site, stage 2: Secondary | ICD-10-CM

## 2011-12-18 ENCOUNTER — Ambulatory Visit: Payer: Medicare Other | Admitting: Internal Medicine

## 2011-12-23 ENCOUNTER — Ambulatory Visit: Payer: Medicare Other | Admitting: Internal Medicine

## 2012-02-20 ENCOUNTER — Telehealth: Payer: Self-pay | Admitting: *Deleted

## 2012-02-20 MED ORDER — CYCLOBENZAPRINE HCL 5 MG PO TABS: 5.0000 mg | ORAL_TABLET | Freq: Three times a day (TID) | ORAL | Status: DC | PRN

## 2012-02-20 NOTE — Telephone Encounter (Signed)
We can call in refill on Flexeril.  Is pt in a facility or still living at home alone? I would prefer to see her before writing for reglan.

## 2012-02-20 NOTE — Telephone Encounter (Signed)
This has been a chronic, and recurrent issue for her. If symptoms of diarrhea severe, then she should be seen at urgent care or in ED.

## 2012-02-20 NOTE — Telephone Encounter (Signed)
Flexeril sent in. Patient scheduled appt to see you, but she couldn't in until may 15th. She is asking what she can do in the meantime to help with her stomach?

## 2012-02-20 NOTE — Telephone Encounter (Signed)
Patent c/o problems w/stomach again; BMs with every thing eaten and would like to get a Rx for Reglan that was prescribed while in hospital in December 2012, also requesting refill on cyclobenzaprine [last refill 10.12.12 #30x1]. Please advsie.

## 2012-02-23 NOTE — Telephone Encounter (Signed)
Patient notified

## 2012-03-03 ENCOUNTER — Ambulatory Visit: Payer: Medicare Other | Admitting: Internal Medicine

## 2012-03-03 DIAGNOSIS — Z0289 Encounter for other administrative examinations: Secondary | ICD-10-CM

## 2012-03-05 ENCOUNTER — Ambulatory Visit: Payer: Medicare Other | Admitting: Internal Medicine

## 2012-03-17 ENCOUNTER — Other Ambulatory Visit: Payer: Self-pay | Admitting: *Deleted

## 2012-03-17 MED ORDER — SIMVASTATIN 40 MG PO TABS: 40.0000 mg | ORAL_TABLET | Freq: Every day | ORAL | Status: DC

## 2012-03-17 NOTE — Telephone Encounter (Signed)
Received faxed refill request from pharmacy. Please advise when patient needs lab work. Is it okay to refill medication? 

## 2012-03-17 NOTE — Telephone Encounter (Signed)
This pt is changing all of her care to a home physician. We should fill 1 month supply only, then she can follow with them for labs.

## 2012-03-23 ENCOUNTER — Telehealth: Payer: Self-pay | Admitting: Internal Medicine

## 2012-03-23 NOTE — Telephone Encounter (Signed)
Carollee Herter, Can you clarify this? I thought she moved records from this practice and had a new PCP? She would not need a referral to Dr. Freeman Caldron as he is also internal med. I never saw any requests for lantus refills?

## 2012-03-23 NOTE — Telephone Encounter (Signed)
Pt called she wants referral to dr Sharin Grave  [phone] Pt stated she tried to get in two times was turn away and memorial weekend she called to get her lantus and this wasn't taken care of until after the holidays on Tuesday. Pt stated she was completely out of meds.  She had regular insulin until she could get this refilled.  Her blood sugar ran 568  Then next time he registerd high on her meter.  Pt stated she didn't feel comfortable going to the er setting for hours by her self  Pt stated she was going to be moving below [place] with her children soon

## 2012-03-24 NOTE — Telephone Encounter (Signed)
I spoke with Misty Stanley at dr. Jerilynn Birkenhead office and she states patient has only been seen there as a diabetic patient.  I asked her would he be wiling to see patient as her PCP.  She states that they haven't seen patient since July 2011.  She said that the way Dr. Freeman Caldron will take on a new patient is if we send over last office note, labs and he will review and then call patient with his decision.

## 2012-03-24 NOTE — Telephone Encounter (Signed)
She has been over late to visits, so we have tried to reschedule. She must have visit scheduled.

## 2012-03-24 NOTE — Telephone Encounter (Signed)
I spoke with patient and she thought you didn't wanted to see her anymore.  She states that since she was turned away the last two times and the lantus she just thought you didn't want to see her anymore.  She states that one time she came on the wrong date for the appointment, and the second time was Jeanette Hester turned her away because she was late, after she told patient a certain time to get there but she couldn't help it because of transportation bringing her late.  I have made her an appointment here since she feels she needs to increase her lantus.  She doesn't really want to switch providers, she just thought you didn't want her as a patient.

## 2012-03-25 NOTE — Telephone Encounter (Signed)
I have documented in her FYI and demographic area that patient needs a 30 min. Appointment.

## 2012-04-14 ENCOUNTER — Ambulatory Visit (INDEPENDENT_AMBULATORY_CARE_PROVIDER_SITE_OTHER): Payer: Medicare Other | Admitting: Internal Medicine

## 2012-04-14 ENCOUNTER — Encounter: Payer: Self-pay | Admitting: Internal Medicine

## 2012-04-14 VITALS — BP 130/70 | HR 89 | Temp 98.5°F

## 2012-04-14 DIAGNOSIS — R52 Pain, unspecified: Secondary | ICD-10-CM

## 2012-04-14 DIAGNOSIS — M129 Arthropathy, unspecified: Secondary | ICD-10-CM

## 2012-04-14 DIAGNOSIS — IMO0001 Reserved for inherently not codable concepts without codable children: Secondary | ICD-10-CM

## 2012-04-14 DIAGNOSIS — L03111 Cellulitis of right axilla: Secondary | ICD-10-CM | POA: Insufficient documentation

## 2012-04-14 DIAGNOSIS — E1165 Type 2 diabetes mellitus with hyperglycemia: Secondary | ICD-10-CM | POA: Insufficient documentation

## 2012-04-14 DIAGNOSIS — R300 Dysuria: Secondary | ICD-10-CM | POA: Insufficient documentation

## 2012-04-14 DIAGNOSIS — M1990 Unspecified osteoarthritis, unspecified site: Secondary | ICD-10-CM

## 2012-04-14 DIAGNOSIS — IMO0002 Reserved for concepts with insufficient information to code with codable children: Secondary | ICD-10-CM

## 2012-04-14 DIAGNOSIS — R1084 Generalized abdominal pain: Secondary | ICD-10-CM | POA: Insufficient documentation

## 2012-04-14 DIAGNOSIS — R197 Diarrhea, unspecified: Secondary | ICD-10-CM

## 2012-04-14 LAB — POCT URINALYSIS DIPSTICK
Bilirubin, UA: NEGATIVE
Glucose, UA: NEGATIVE
Ketones, UA: NEGATIVE
Nitrite, UA: NEGATIVE
Protein, UA: 30
Spec Grav, UA: 1.02
Urobilinogen, UA: 0.2
pH, UA: 7

## 2012-04-14 MED ORDER — DOXYCYCLINE HYCLATE 100 MG PO TABS: 100.0000 mg | ORAL_TABLET | Freq: Two times a day (BID) | ORAL | Status: AC

## 2012-04-14 MED ORDER — INSULIN GLARGINE 100 UNIT/ML ~~LOC~~ SOLN: 63.0000 [IU] | Freq: Every day | SUBCUTANEOUS | Status: AC

## 2012-04-14 NOTE — Addendum Note (Signed)
Addended by: Jobie Quaker on: 04/14/2012 05:01 PM   Modules accepted: Orders

## 2012-04-14 NOTE — Progress Notes (Signed)
Subjective:    Patient ID: Jeanette Hester, female    DOB: January 28, 1941, 71 y.o.   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 She has several concerns today. First, in regards to her diabetes, she reports that blood sugars have been elevated as high as 500. She had initially tried increasing her Lantus dose to 63 units daily, but has had some low blood sugars at this level. She is not compliant with diet. She has not had any recent low blood sugars. She decides whether or not to take her Lantus based on her fasting sugar in the morning.  Secondly, she is concerned about recent episodes of loose stool. She reports that with any food intake, she has loose bowel movement. She denies blood in her stool, watery diarrhea, fever, chills. She does have some abdominal cramping and discomfort prior to bowel movement.  Third, she is concerned about dysuria. She reports that this has been ongoing off and on for several months. She reports some urgency with urination. She denies fever, chills, or flank pain.  Fourth, she is concerned about red bumps under her right arm. These have been present for several weeks. They have drained some purulent fluid. She has not treated them with any topical or oral antibiotics. She is unsure whether she may have a bug bite at this location.  In regards to her chronic ongoing issues with immobility she reports that she is getting a second opinion from another orthopedic surgeon next week. She is trying to settle her case with her insurance company prior to moving. She has been planning to move to assisted living facility if possible.  Outpatient Encounter Prescriptions as of 04/14/2012  Medication Sig Dispense Refill  . ALPRAZolam (XANAX) 0.5 MG tablet Take 0.5 mg by mouth at bedtime as needed.        Marland Kitchen amLODipine (NORVASC) 5 MG tablet Take  5 mg by mouth daily.        . Choline Fenofibrate 135 MG capsule Take 135 mg by mouth daily.        . Choline Fenofibrate 135 MG capsule Take 1 capsule (135 mg total) by mouth daily.  90 capsule  1  . clopidogrel (PLAVIX) 75 MG tablet Take 75 mg by mouth daily.        . cyclobenzaprine (FLEXERIL) 5 MG tablet Take 1 tablet (5 mg total) by mouth every 8 (eight) hours as needed for muscle spasms.  30 tablet  0  . fenofibrate (TRICOR) 145 MG tablet Take 145 mg by mouth daily.        . fentaNYL (DURAGESIC - DOSED MCG/HR) 50 MCG/HR Place 1 patch (50 mcg total) onto the skin every 3 (three) days.  5 patch  0  . furosemide (LASIX) 80 MG tablet Take 0.5 tablets (40 mg total) by mouth daily.  45 tablet  1  . gabapentin (NEURONTIN) 300 MG capsule Take 1 capsule (300 mg total) by mouth 4 (four) times daily.  120 capsule  3  . insulin glargine (LANTUS) 100 UNIT/ML injection Inject 63 Units into the skin at bedtime.  10 mL  3  . levothyroxine (SYNTHROID, LEVOTHROID) 112 MCG tablet Take 1 tablet (112 mcg total) by mouth daily.  90 tablet  1  . metFORMIN (GLUCOPHAGE) 500 MG tablet Take 500 mg by mouth daily as needed.        . mirtazapine (REMERON)  15 MG tablet Take 1 tablet (15 mg total) by mouth at bedtime.  90 tablet  1  . Misc. Devices (TRANSFER BENCH) MISC As directed DX: DJD, pelvic fracture  1 each  0  . Oxycodone HCl 10 MG TABS Take 1 tablet (10 mg total) by mouth every 4 (four) hours as needed.  90 each  0  . pantoprazole (PROTONIX) 40 MG tablet Take 40 mg by mouth daily.        . potassium chloride (KLOR-CON) 10 MEQ CR tablet Take 10 mEq by mouth daily.        . simvastatin (ZOCOR) 40 MG tablet Take 1 tablet (40 mg total) by mouth at bedtime.  30 tablet  0  . valACYclovir (VALTREX) 500 MG tablet Take 1 tablet (500 mg total) by mouth daily. May take bid prn outbreak then resume qd dosing  60 tablet  3  . venlafaxine (EFFEXOR-XR) 150 MG 24 hr capsule Take 150 mg by mouth daily.        Marland Kitchen zolpidem (AMBIEN)  10 MG tablet Take 10 mg by mouth at bedtime as needed.        Marland Kitchen DISCONTD: insulin glargine (LANTUS) 100 UNIT/ML injection Inject 30 Units into the skin at bedtime.  10 mL  3  . doxycycline (VIBRA-TABS) 100 MG tablet Take 1 tablet (100 mg total) by mouth 2 (two) times daily.  20 tablet  0    Review of Systems  Constitutional: Negative for fever, chills, appetite change, fatigue and unexpected weight change.  HENT: Negative for ear pain, congestion, sore throat, trouble swallowing, neck pain, voice change and sinus pressure.   Eyes: Negative for visual disturbance.  Respiratory: Negative for cough, shortness of breath, wheezing and stridor.   Cardiovascular: Negative for chest pain, palpitations and leg swelling.  Gastrointestinal: Positive for abdominal pain and diarrhea. Negative for nausea, vomiting, constipation, blood in stool, abdominal distention and anal bleeding.  Genitourinary: Negative for dysuria and flank pain.  Musculoskeletal: Positive for myalgias, back pain, arthralgias and gait problem.  Skin: Negative for color change and rash.  Neurological: Positive for weakness. Negative for dizziness and headaches.  Hematological: Negative for adenopathy. Does not bruise/bleed easily.  Psychiatric/Behavioral: Negative for suicidal ideas, disturbed wake/sleep cycle and dysphoric mood. The patient is not nervous/anxious.        Objective:   Physical Exam  Constitutional: She is oriented to person, place, and time. She appears well-developed and well-nourished. No distress.       Obese  HENT:  Head: Normocephalic and atraumatic.  Right Ear: External ear normal.  Left Ear: External ear normal.  Nose: Nose normal.  Mouth/Throat: Oropharynx is clear and moist. No oropharyngeal exudate.  Eyes: Conjunctivae are normal. Pupils are equal, round, and reactive to light. Right eye exhibits no discharge. Left eye exhibits no discharge. No scleral icterus.  Neck: Normal range of motion. Neck  supple. No tracheal deviation present. No thyromegaly present.  Cardiovascular: Normal rate, regular rhythm, normal heart sounds and intact distal pulses.  Exam reveals no gallop and no friction rub.   No murmur heard. Pulmonary/Chest: Effort normal and breath sounds normal. No respiratory distress. She has no wheezes. She has no rales. She exhibits no tenderness.  Abdominal: Soft. Normal appearance and bowel sounds are normal.  Musculoskeletal: Normal range of motion. She exhibits no edema and no tenderness.  Lymphadenopathy:    She has no cervical adenopathy.  Neurological: She is alert and oriented to person, place, and time. No  cranial nerve deficit. She exhibits normal muscle tone. Coordination normal.  Skin: Skin is warm and dry. Rash noted. Rash is pustular. She is not diaphoretic. No erythema. No pallor.     Psychiatric: She has a normal mood and affect. Her behavior is normal. Judgment and thought content normal.          Assessment & Plan:

## 2012-04-14 NOTE — Assessment & Plan Note (Signed)
Will send stool for culture and C. difficile testing. Will check CMP with labs today to evaluate for dehydration. Discussed setting up GI referral for colonoscopy, however patient would like to hold off for now because prep for colonoscopy significantly difficult for her because of immobility and difficulty transferring onto toilet. Given abdominal pain with diarrhea, we'll also get CT abdomen for further evaluation.

## 2012-04-14 NOTE — Assessment & Plan Note (Signed)
Patient with erythematous pustules in right axilla. Will treat with doxycycline. Patient will call if symptoms are not improving.

## 2012-04-14 NOTE — Assessment & Plan Note (Signed)
Patient continues to have widely variable blood sugars. Will continue Lantus dose at 63 units daily and will check A1c with labs today. Would favor allowing her A1c to be slightly above 8 given that she lives alone and has had frequent episodes of hypoglycemia leading to unresponsiveness.

## 2012-04-14 NOTE — Assessment & Plan Note (Signed)
Will send urinalysis and culture today. Will treat with doxycycline, given ongoing cellulitis as well.

## 2012-04-14 NOTE — Assessment & Plan Note (Signed)
Patient with some abdominal discomfort with episodes of loose stool. Examination is limited because of morbid obesity. Will get CT of the abdomen for further evaluation.

## 2012-04-15 LAB — CBC WITH DIFFERENTIAL/PLATELET
Basophils Absolute: 0 10*3/uL (ref 0.0–0.1)
Eosinophils Absolute: 0 10*3/uL (ref 0.0–0.7)
Hemoglobin: 11.4 g/dL — ABNORMAL LOW (ref 12.0–15.0)
Lymphocytes Relative: 15 % (ref 12.0–46.0)
Lymphs Abs: 1 10*3/uL (ref 0.7–4.0)
MCHC: 32.3 g/dL (ref 30.0–36.0)
MCV: 89.5 fl (ref 78.0–100.0)
Monocytes Absolute: 0.4 10*3/uL (ref 0.1–1.0)
Neutro Abs: 5.4 10*3/uL (ref 1.4–7.7)
RDW: 15 % — ABNORMAL HIGH (ref 11.5–14.6)

## 2012-04-15 LAB — LIPID PANEL
Cholesterol: 123 mg/dL (ref 0–200)
HDL: 39.1 mg/dL (ref >39.00)
LDL Cholesterol: 57 mg/dL (ref 0–99)
Total CHOL/HDL Ratio: 3
Triglycerides: 133 mg/dL (ref 0.0–149.0)

## 2012-04-15 LAB — COMPREHENSIVE METABOLIC PANEL
ALT: 16 U/L (ref 0–35)
AST: 33 U/L (ref 0–37)
Alkaline Phosphatase: 69 U/L (ref 39–117)
BUN: 42 mg/dL — ABNORMAL HIGH (ref 6–23)
Chloride: 98 mEq/L (ref 96–112)
Creatinine, Ser: 1.7 mg/dL — ABNORMAL HIGH (ref 0.4–1.2)
Total Bilirubin: 0.5 mg/dL (ref 0.3–1.2)

## 2012-04-16 ENCOUNTER — Other Ambulatory Visit: Payer: Self-pay | Admitting: Internal Medicine

## 2012-04-16 ENCOUNTER — Other Ambulatory Visit: Payer: Self-pay | Admitting: *Deleted

## 2012-04-16 DIAGNOSIS — R109 Unspecified abdominal pain: Secondary | ICD-10-CM

## 2012-04-16 LAB — ANA: Anti Nuclear Antibody(ANA): NEGATIVE

## 2012-04-17 LAB — URINE CULTURE: Colony Count: >=100000

## 2012-04-19 ENCOUNTER — Other Ambulatory Visit: Payer: Self-pay | Admitting: *Deleted

## 2012-04-19 ENCOUNTER — Telehealth: Payer: Self-pay | Admitting: Internal Medicine

## 2012-04-19 DIAGNOSIS — Z1211 Encounter for screening for malignant neoplasm of colon: Secondary | ICD-10-CM

## 2012-04-19 MED ORDER — CIPROFLOXACIN HCL 250 MG PO TABS: 250.0000 mg | ORAL_TABLET | Freq: Two times a day (BID) | ORAL | Status: DC

## 2012-04-19 NOTE — Telephone Encounter (Signed)
I reviewed documentation on colonoscopy from 1997. Patient was supposed to have a repeat colonoscopy in one year. I do not see that this is been performed. She will need referral to GI for colonoscopy.

## 2012-04-20 NOTE — Telephone Encounter (Signed)
Patient stated that she will need a referral to GI doctor.

## 2012-04-23 ENCOUNTER — Ambulatory Visit: Payer: Self-pay | Admitting: Internal Medicine

## 2012-04-23 ENCOUNTER — Telehealth: Payer: Self-pay | Admitting: Internal Medicine

## 2012-04-23 ENCOUNTER — Other Ambulatory Visit: Payer: Self-pay | Admitting: Internal Medicine

## 2012-04-23 DIAGNOSIS — N390 Urinary tract infection, site not specified: Secondary | ICD-10-CM

## 2012-04-23 NOTE — Telephone Encounter (Signed)
Patient advised as instructed via telephone, she will wait to hear from [facility] regarding appt with Dr. Achilles Dunk.

## 2012-04-23 NOTE — Telephone Encounter (Signed)
CT abdomen showed air within the bladder suggestive of infection. I would like to set her up with Dr. Achilles Dunk for further evaluation of this, as may be loculated infection.  There was a stable left lower lobe pulmonary nodule, which we should follow with CT Chest in 6 months, 10/2012.

## 2012-04-29 ENCOUNTER — Encounter: Payer: Self-pay | Admitting: Internal Medicine

## 2012-05-07 ENCOUNTER — Other Ambulatory Visit: Payer: Self-pay | Admitting: *Deleted

## 2012-05-07 DIAGNOSIS — R609 Edema, unspecified: Secondary | ICD-10-CM

## 2012-05-07 DIAGNOSIS — F329 Major depressive disorder, single episode, unspecified: Secondary | ICD-10-CM

## 2012-05-07 MED ORDER — MIRTAZAPINE 15 MG PO TABS: 15.0000 mg | ORAL_TABLET | Freq: Every day | ORAL | Status: DC

## 2012-05-07 MED ORDER — CHOLINE FENOFIBRATE 135 MG PO CPDR: 135.0000 mg | DELAYED_RELEASE_CAPSULE | Freq: Every day | ORAL | Status: DC

## 2012-05-07 MED ORDER — FUROSEMIDE 80 MG PO TABS: 40.0000 mg | ORAL_TABLET | Freq: Every day | ORAL | Status: AC

## 2012-05-07 MED ORDER — FEXOFENADINE HCL 180 MG PO TABS: 180.0000 mg | ORAL_TABLET | Freq: Every day | ORAL | Status: AC

## 2012-05-07 MED ORDER — SIMVASTATIN 40 MG PO TABS: 40.0000 mg | ORAL_TABLET | Freq: Every day | ORAL | Status: DC

## 2012-05-13 ENCOUNTER — Other Ambulatory Visit: Payer: Self-pay | Admitting: *Deleted

## 2012-05-13 DIAGNOSIS — G629 Polyneuropathy, unspecified: Secondary | ICD-10-CM

## 2012-05-13 DIAGNOSIS — E039 Hypothyroidism, unspecified: Secondary | ICD-10-CM

## 2012-05-13 DIAGNOSIS — A6000 Herpesviral infection of urogenital system, unspecified: Secondary | ICD-10-CM

## 2012-05-13 MED ORDER — VALACYCLOVIR HCL 500 MG PO TABS: 500.0000 mg | ORAL_TABLET | Freq: Every day | ORAL | Status: AC

## 2012-05-13 MED ORDER — LEVOTHYROXINE SODIUM 112 MCG PO TABS: 112.0000 ug | ORAL_TABLET | Freq: Every day | ORAL | Status: AC

## 2012-05-14 ENCOUNTER — Other Ambulatory Visit: Payer: Self-pay | Admitting: *Deleted

## 2012-05-14 DIAGNOSIS — F329 Major depressive disorder, single episode, unspecified: Secondary | ICD-10-CM

## 2012-05-14 DIAGNOSIS — G629 Polyneuropathy, unspecified: Secondary | ICD-10-CM

## 2012-05-14 MED ORDER — GABAPENTIN 300 MG PO CAPS: 300.0000 mg | ORAL_CAPSULE | Freq: Four times a day (QID) | ORAL | Status: DC

## 2012-05-14 MED ORDER — VENLAFAXINE HCL ER 150 MG PO CP24: 150.0000 mg | ORAL_CAPSULE | Freq: Every day | ORAL | Status: AC

## 2012-05-14 MED ORDER — SIMVASTATIN 40 MG PO TABS: 40.0000 mg | ORAL_TABLET | Freq: Every day | ORAL | Status: AC

## 2012-05-14 MED ORDER — MIRTAZAPINE 15 MG PO TABS: 15.0000 mg | ORAL_TABLET | Freq: Every day | ORAL | Status: AC

## 2012-05-14 MED ORDER — CYCLOBENZAPRINE HCL 5 MG PO TABS: 5.0000 mg | ORAL_TABLET | Freq: Three times a day (TID) | ORAL | Status: AC | PRN

## 2012-05-14 MED ORDER — CHOLINE FENOFIBRATE 135 MG PO CPDR: 135.0000 mg | DELAYED_RELEASE_CAPSULE | Freq: Every day | ORAL | Status: AC

## 2012-05-17 ENCOUNTER — Encounter: Payer: Self-pay | Admitting: Internal Medicine

## 2012-05-17 ENCOUNTER — Ambulatory Visit (INDEPENDENT_AMBULATORY_CARE_PROVIDER_SITE_OTHER): Payer: Medicare Other | Admitting: Internal Medicine

## 2012-05-17 VITALS — BP 130/70 | HR 68 | Temp 98.3°F

## 2012-05-17 DIAGNOSIS — R911 Solitary pulmonary nodule: Secondary | ICD-10-CM

## 2012-05-17 DIAGNOSIS — K219 Gastro-esophageal reflux disease without esophagitis: Secondary | ICD-10-CM

## 2012-05-17 DIAGNOSIS — IMO0001 Reserved for inherently not codable concepts without codable children: Secondary | ICD-10-CM

## 2012-05-17 DIAGNOSIS — I10 Essential (primary) hypertension: Secondary | ICD-10-CM

## 2012-05-17 DIAGNOSIS — N183 Chronic kidney disease, stage 3 unspecified: Secondary | ICD-10-CM

## 2012-05-17 DIAGNOSIS — E1165 Type 2 diabetes mellitus with hyperglycemia: Secondary | ICD-10-CM

## 2012-05-17 LAB — COMPREHENSIVE METABOLIC PANEL
ALT: 10 U/L (ref 0–35)
CO2: 30 mEq/L (ref 19–32)
Calcium: 9 mg/dL (ref 8.4–10.5)
Chloride: 99 mEq/L (ref 96–112)
Creatinine, Ser: 1.7 mg/dL — ABNORMAL HIGH (ref 0.4–1.2)
GFR: 31.3 mL/min — ABNORMAL LOW (ref >60.00)
Glucose, Bld: 408 mg/dL — ABNORMAL HIGH (ref 70–99)

## 2012-05-17 MED ORDER — CLOPIDOGREL BISULFATE 75 MG PO TABS: 75.0000 mg | ORAL_TABLET | Freq: Every day | ORAL | Status: AC

## 2012-05-17 MED ORDER — ESOMEPRAZOLE MAGNESIUM 40 MG PO CPDR: 40.0000 mg | DELAYED_RELEASE_CAPSULE | Freq: Every day | ORAL | Status: AC

## 2012-05-17 NOTE — Assessment & Plan Note (Signed)
Recent hemoglobin A1c one month ago showed worsening blood sugar control. However, over the last month, patient has improved her compliance with medications and diet and record of blood sugars are improved. Will continue to monitor. We'll plan to repeat A1c in 2 months.

## 2012-05-17 NOTE — Assessment & Plan Note (Signed)
Blood sugars well-controlled on current medications. We'll plan to continue. Followup in 2 months.
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
## 2012-05-17 NOTE — Assessment & Plan Note (Signed)
Secondary to HTN and DM. Labs show persistent elevation of creatinine at 1.7. Patient is followed by nephrology. Will forward labs to their office.

## 2012-05-25 ENCOUNTER — Telehealth: Payer: Self-pay

## 2012-05-25 NOTE — Telephone Encounter (Signed)
Pt feels she can not manage "laxative"/prep for colonoscopy b/c she is wheelchair bound and "only has an assistant 4 hrs in the am".  Encouraged pt regarding importance of procedure in cancer detection.  She will call us back if she changes her mind and she plans to see Dr Dan Humphreys for f/u.

## 2012-06-08 ENCOUNTER — Encounter: Payer: Medicare Other | Admitting: Internal Medicine

## 2012-06-17 ENCOUNTER — Telehealth: Payer: Self-pay | Admitting: Internal Medicine

## 2012-06-17 NOTE — Telephone Encounter (Signed)
fl2 form in box °

## 2012-06-22 NOTE — Telephone Encounter (Signed)
Left message on voicemail for Neita Carp advising that [facility] form is ready for pick up will be left at front desk.

## 2012-06-25 ENCOUNTER — Encounter: Payer: Self-pay | Admitting: Internal Medicine

## 2012-07-26 ENCOUNTER — Telehealth: Payer: Self-pay | Admitting: Internal Medicine

## 2012-07-26 NOTE — Telephone Encounter (Signed)
Refill request for venlafaxine hcl xr 150 mg cer #90 Sig: take one capsule by mouth every day

## 2012-11-01 ENCOUNTER — Other Ambulatory Visit: Payer: Self-pay | Admitting: Internal Medicine

## 2012-11-01 DIAGNOSIS — G629 Polyneuropathy, unspecified: Secondary | ICD-10-CM

## 2012-11-01 MED ORDER — GABAPENTIN 300 MG PO CAPS: 300.0000 mg | ORAL_CAPSULE | Freq: Four times a day (QID) | ORAL | Status: AC

## 2012-11-01 NOTE — Telephone Encounter (Signed)
gabapentin (NEURONTIN) 300 MG capsule  # 120

## 2012-11-25 ENCOUNTER — Inpatient Hospital Stay: Payer: Self-pay | Admitting: Internal Medicine

## 2012-11-25 LAB — COMPREHENSIVE METABOLIC PANEL
Albumin: 3.4 g/dL (ref 3.4–5.0)
Alkaline Phosphatase: 59 U/L (ref 50–136)
Anion Gap: 5 — ABNORMAL LOW (ref 7–16)
Creatinine: 1.59 mg/dL — ABNORMAL HIGH (ref 0.60–1.30)
EGFR (Non-African Amer.): 32 — ABNORMAL LOW
Glucose: 292 mg/dL — ABNORMAL HIGH (ref 65–99)
SGPT (ALT): 15 U/L (ref 12–78)
Sodium: 136 mmol/L (ref 136–145)

## 2012-11-25 LAB — URINALYSIS, COMPLETE
Glucose,UR: >=500 mg/dL (ref 0–75)
Ketone: NEGATIVE
Nitrite: NEGATIVE
Squamous Epithelial: 1
WBC UR: 116 /HPF (ref 0–5)

## 2012-11-25 LAB — CBC
HCT: 34.4 % — ABNORMAL LOW (ref 35.0–47.0)
MCH: 30 pg (ref 26.0–34.0)
MCHC: 33.4 g/dL (ref 32.0–36.0)
Platelet: 120 10*3/uL — ABNORMAL LOW (ref 150–440)
RBC: 3.83 10*6/uL (ref 3.80–5.20)
RDW: 14.4 % (ref 11.5–14.5)

## 2012-11-25 LAB — MAGNESIUM: Magnesium: 1.9 mg/dL

## 2012-11-25 LAB — CK TOTAL AND CKMB (NOT AT ARMC)
CK, Total: 59 U/L (ref 21–215)
CK-MB: 0.6 ng/mL (ref 0.5–3.6)

## 2012-11-25 LAB — TSH: Thyroid Stimulating Horm: 2 u[IU]/mL

## 2012-11-25 LAB — TROPONIN I: Troponin-I: <0.02 ng/mL

## 2012-11-26 LAB — CBC WITH DIFFERENTIAL/PLATELET
Basophil #: 0 10*3/uL (ref 0.0–0.1)
Eosinophil #: 0.1 10*3/uL (ref 0.0–0.7)
Lymphocyte #: 0.7 10*3/uL — ABNORMAL LOW (ref 1.0–3.6)
MCH: 30.4 pg (ref 26.0–34.0)
MCV: 90 fL (ref 80–100)
Monocyte #: 0.5 x10 3/mm (ref 0.2–0.9)
Platelet: 115 10*3/uL — ABNORMAL LOW (ref 150–440)
RDW: 13.9 % (ref 11.5–14.5)

## 2012-11-26 LAB — BASIC METABOLIC PANEL
Chloride: 103 mmol/L (ref 98–107)
EGFR (Non-African Amer.): 32 — ABNORMAL LOW
Potassium: 3.5 mmol/L (ref 3.5–5.1)

## 2012-11-27 LAB — URINE CULTURE

## 2012-11-29 LAB — BASIC METABOLIC PANEL
Anion Gap: 4 — ABNORMAL LOW (ref 7–16)
BUN: 18 mg/dL (ref 7–18)
Calcium, Total: 8.6 mg/dL (ref 8.5–10.1)
Co2: 29 mmol/L (ref 21–32)
EGFR (Non-African Amer.): 42 — ABNORMAL LOW
Glucose: 142 mg/dL — ABNORMAL HIGH (ref 65–99)
Potassium: 4.3 mmol/L (ref 3.5–5.1)
Sodium: 139 mmol/L (ref 136–145)

## 2012-11-30 ENCOUNTER — Encounter: Payer: Self-pay | Admitting: Internal Medicine

## 2012-12-18 ENCOUNTER — Encounter: Payer: Self-pay | Admitting: Internal Medicine

## 2013-03-08 ENCOUNTER — Telehealth: Payer: Self-pay | Admitting: *Deleted

## 2013-03-08 NOTE — Telephone Encounter (Signed)
Refill Request  BD SYR 1CC 30G  #100  Use as directed

## 2013-03-09 MED ORDER — "SYRINGE/NEEDLE (DISP) 30G X 1/2"" 1 ML MISC": 1.0000 | Freq: Every day | Status: AC

## 2013-03-09 NOTE — Telephone Encounter (Signed)
Escribed

## 2013-04-15 ENCOUNTER — Inpatient Hospital Stay: Payer: Self-pay | Admitting: Internal Medicine

## 2013-04-15 LAB — URINALYSIS, COMPLETE
Bilirubin,UR: NEGATIVE
Nitrite: NEGATIVE
Ph: 5 (ref 4.5–8.0)
Specific Gravity: 1.021 (ref 1.003–1.030)
Squamous Epithelial: <1

## 2013-04-15 LAB — CBC
HCT: 39.1 % (ref 35.0–47.0)
HGB: 12.8 g/dL (ref 12.0–16.0)
MCHC: 32.7 g/dL (ref 32.0–36.0)
MCV: 88 fL (ref 80–100)
Platelet: 139 10*3/uL — ABNORMAL LOW (ref 150–440)
RDW: 15.2 % — ABNORMAL HIGH (ref 11.5–14.5)

## 2013-04-15 LAB — COMPREHENSIVE METABOLIC PANEL
Albumin: 3.7 g/dL (ref 3.4–5.0)
Alkaline Phosphatase: 70 U/L (ref 50–136)
BUN: 42 mg/dL — ABNORMAL HIGH (ref 7–18)
Bilirubin,Total: 0.6 mg/dL (ref 0.2–1.0)
Calcium, Total: 9.6 mg/dL (ref 8.5–10.1)
Chloride: 101 mmol/L (ref 98–107)
Creatinine: 1.75 mg/dL — ABNORMAL HIGH (ref 0.60–1.30)
Osmolality: 312 (ref 275–301)
SGOT(AST): 25 U/L (ref 15–37)
Sodium: 138 mmol/L (ref 136–145)
Total Protein: 7 g/dL (ref 6.4–8.2)

## 2013-04-15 LAB — TROPONIN I: Troponin-I: <0.02 ng/mL

## 2013-04-16 LAB — BASIC METABOLIC PANEL
Anion Gap: 10 (ref 7–16)
BUN: 40 mg/dL — ABNORMAL HIGH (ref 7–18)
Creatinine: 1.48 mg/dL — ABNORMAL HIGH (ref 0.60–1.30)
EGFR (African American): 41 — ABNORMAL LOW
Glucose: 128 mg/dL — ABNORMAL HIGH (ref 65–99)
Osmolality: 306 (ref 275–301)
Sodium: 148 mmol/L — ABNORMAL HIGH (ref 136–145)

## 2013-04-16 LAB — CBC WITH DIFFERENTIAL/PLATELET
Basophil #: 0.1 10*3/uL (ref 0.0–0.1)
HGB: 11.2 g/dL — ABNORMAL LOW (ref 12.0–16.0)
Lymphocyte #: 0.8 10*3/uL — ABNORMAL LOW (ref 1.0–3.6)
MCH: 29.3 pg (ref 26.0–34.0)
MCHC: 34.2 g/dL (ref 32.0–36.0)
Monocyte #: 0.5 x10 3/mm (ref 0.2–0.9)
Monocyte %: 5.7 %
Neutrophil #: 7.7 10*3/uL — ABNORMAL HIGH (ref 1.4–6.5)
Neutrophil %: 84.7 %
Platelet: 152 10*3/uL (ref 150–440)
RBC: 3.83 10*6/uL (ref 3.80–5.20)
RDW: 15.2 % — ABNORMAL HIGH (ref 11.5–14.5)

## 2013-04-16 LAB — HEMOGLOBIN A1C: Hemoglobin A1C: 8.1 % — ABNORMAL HIGH (ref 4.2–6.3)

## 2013-04-16 LAB — TSH: Thyroid Stimulating Horm: 1.67 u[IU]/mL

## 2013-04-17 LAB — BASIC METABOLIC PANEL
BUN: 28 mg/dL — ABNORMAL HIGH (ref 7–18)
Calcium, Total: 10.1 mg/dL (ref 8.5–10.1)
Chloride: 109 mmol/L — ABNORMAL HIGH (ref 98–107)
Creatinine: 1.34 mg/dL — ABNORMAL HIGH (ref 0.60–1.30)
EGFR (African American): 46 — ABNORMAL LOW
EGFR (Non-African Amer.): 40 — ABNORMAL LOW
Osmolality: 299 (ref 275–301)
Potassium: 3.1 mmol/L — ABNORMAL LOW (ref 3.5–5.1)
Sodium: 145 mmol/L (ref 136–145)

## 2013-04-25 ENCOUNTER — Telehealth: Payer: Self-pay | Admitting: *Deleted

## 2013-04-25 NOTE — Telephone Encounter (Signed)
Received notification patient was in the hospital from 6/27-04/19/13. Tried to call patient to schedule a hospital follow up with Dr. Dan Humphreys and see how she was doing, no answer or voicemail to leave message on neither phones. Last seen in July 2013. Patient was discharged to rehab.

## 2013-06-16 ENCOUNTER — Inpatient Hospital Stay: Payer: Self-pay | Admitting: Internal Medicine

## 2013-06-16 LAB — URINALYSIS, COMPLETE
Bacteria: NONE SEEN
Bilirubin,UR: NEGATIVE
Glucose,UR: NEGATIVE mg/dL (ref 0–75)
Hyaline Cast: 6
Ketone: NEGATIVE
Nitrite: NEGATIVE
Ph: 5 (ref 4.5–8.0)
Protein: 100
RBC,UR: 1 /HPF (ref 0–5)
Specific Gravity: 1.018 (ref 1.003–1.030)
Squamous Epithelial: NONE SEEN
WBC UR: 11 /HPF (ref 0–5)

## 2013-06-16 LAB — CBC WITH DIFFERENTIAL/PLATELET
Basophil #: 0 10*3/uL (ref 0.0–0.1)
Basophil %: 0.5 %
Eosinophil #: 0 10*3/uL (ref 0.0–0.7)
Eosinophil %: 0.2 %
HCT: 36.3 % (ref 35.0–47.0)
HGB: 12.4 g/dL (ref 12.0–16.0)
Lymphocyte #: 0.6 10*3/uL — ABNORMAL LOW (ref 1.0–3.6)
Lymphocyte %: 8.1 %
MCH: 29.3 pg (ref 26.0–34.0)
MCHC: 34.2 g/dL (ref 32.0–36.0)
MCV: 86 fL (ref 80–100)
Monocyte #: 0.4 x10 3/mm (ref 0.2–0.9)
Monocyte %: 5.8 %
Neutrophil #: 5.9 10*3/uL (ref 1.4–6.5)
Neutrophil %: 85.4 %
Platelet: 188 10*3/uL (ref 150–440)
RBC: 4.25 10*6/uL (ref 3.80–5.20)
RDW: 14.8 % — ABNORMAL HIGH (ref 11.5–14.5)
WBC: 6.9 10*3/uL (ref 3.6–11.0)

## 2013-06-16 LAB — TROPONIN I
Troponin-I: 0.25 ng/mL — ABNORMAL HIGH
Troponin-I: 0.18 ng/mL — ABNORMAL HIGH

## 2013-06-16 LAB — COMPREHENSIVE METABOLIC PANEL
Albumin: 3.9 g/dL (ref 3.4–5.0)
Alkaline Phosphatase: 63 U/L (ref 50–136)
Anion Gap: 8 (ref 7–16)
BUN: 49 mg/dL — ABNORMAL HIGH (ref 7–18)
Bilirubin,Total: 0.3 mg/dL (ref 0.2–1.0)
Calcium, Total: 9.4 mg/dL (ref 8.5–10.1)
Chloride: 102 mmol/L (ref 98–107)
Co2: 28 mmol/L (ref 21–32)
Creatinine: 1.84 mg/dL — ABNORMAL HIGH (ref 0.60–1.30)
EGFR (African American): 31 — ABNORMAL LOW
EGFR (Non-African Amer.): 27 — ABNORMAL LOW
Glucose: 89 mg/dL (ref 65–99)
Osmolality: 288 (ref 275–301)
Potassium: 4.1 mmol/L (ref 3.5–5.1)
SGOT(AST): 34 U/L (ref 15–37)
SGPT (ALT): 20 U/L (ref 12–78)
Sodium: 138 mmol/L (ref 136–145)
Total Protein: 7.1 g/dL (ref 6.4–8.2)

## 2013-06-16 LAB — PROTIME-INR
INR: 1.1
Prothrombin Time: 14.4 secs (ref 11.5–14.7)

## 2013-06-16 LAB — CK TOTAL AND CKMB (NOT AT ARMC)
CK, Total: 328 U/L — ABNORMAL HIGH (ref 21–215)
CK, Total: 1904 U/L — ABNORMAL HIGH (ref 21–215)
CK-MB: 3.5 ng/mL (ref 0.5–3.6)

## 2013-06-17 LAB — CBC WITH DIFFERENTIAL/PLATELET
Basophil #: 0 10*3/uL (ref 0.0–0.1)
Basophil %: 0.7 %
Eosinophil %: 0.9 %
HCT: 28.8 % — ABNORMAL LOW (ref 35.0–47.0)
Lymphocyte %: 22.3 %
MCH: 29.3 pg (ref 26.0–34.0)
MCHC: 33.8 g/dL (ref 32.0–36.0)
Monocyte #: 0.6 x10 3/mm (ref 0.2–0.9)
Monocyte %: 12.9 %
Neutrophil #: 3 10*3/uL (ref 1.4–6.5)
Neutrophil %: 63.2 %
Platelet: 151 10*3/uL (ref 150–440)
RBC: 3.32 10*6/uL — ABNORMAL LOW (ref 3.80–5.20)
WBC: 4.7 10*3/uL (ref 3.6–11.0)

## 2013-06-17 LAB — BASIC METABOLIC PANEL
BUN: 43 mg/dL — ABNORMAL HIGH (ref 7–18)
Calcium, Total: 9 mg/dL (ref 8.5–10.1)
Chloride: 106 mmol/L (ref 98–107)
Co2: 29 mmol/L (ref 21–32)
Creatinine: 1.77 mg/dL — ABNORMAL HIGH (ref 0.60–1.30)
EGFR (Non-African Amer.): 28 — ABNORMAL LOW
Osmolality: 301 (ref 275–301)
Potassium: 4.2 mmol/L (ref 3.5–5.1)

## 2013-06-18 LAB — HEMATOCRIT: HCT: 25.7 % — ABNORMAL LOW (ref 35.0–47.0)

## 2013-06-18 LAB — CK: CK, Total: 634 U/L — ABNORMAL HIGH (ref 21–215)

## 2013-06-19 LAB — HEMOGLOBIN: HGB: 8.8 g/dL — ABNORMAL LOW (ref 12.0–16.0)

## 2013-06-21 LAB — CREATININE, SERUM
Creatinine: 1.3 mg/dL (ref 0.60–1.30)
EGFR (Non-African Amer.): 41 — ABNORMAL LOW

## 2013-06-28 ENCOUNTER — Ambulatory Visit: Payer: Medicare Other | Admitting: Internal Medicine

## 2013-07-07 ENCOUNTER — Encounter (HOSPITAL_COMMUNITY): Payer: Self-pay | Admitting: *Deleted

## 2013-07-07 NOTE — Progress Notes (Signed)
SDW assessment was completed by Aggie Cosier, RN ( [facility]) telephone # [phone].

## 2013-07-08 ENCOUNTER — Ambulatory Visit (HOSPITAL_COMMUNITY): Payer: Medicare Other | Admitting: Anesthesiology

## 2013-07-08 ENCOUNTER — Encounter (HOSPITAL_COMMUNITY): Payer: Self-pay | Admitting: Anesthesiology

## 2013-07-08 ENCOUNTER — Ambulatory Visit (HOSPITAL_COMMUNITY)
Admission: RE | Admit: 2013-07-08 | Discharge: 2013-07-08 | Disposition: A | Discharge status: Skilled Nursing Facility | Payer: Medicare Other | Source: Ambulatory Visit | Attending: Orthopedic Surgery | Admitting: Orthopedic Surgery

## 2013-07-08 ENCOUNTER — Encounter (HOSPITAL_COMMUNITY)
Admission: RE | Disposition: A | Discharge status: Skilled Nursing Facility | Payer: Self-pay | Source: Ambulatory Visit | Attending: Orthopedic Surgery

## 2013-07-08 ENCOUNTER — Ambulatory Visit (HOSPITAL_COMMUNITY): Payer: Medicare Other

## 2013-07-08 DIAGNOSIS — L89609 Pressure ulcer of unspecified heel, unspecified stage: Secondary | ICD-10-CM | POA: Insufficient documentation

## 2013-07-08 DIAGNOSIS — Z79899 Other long term (current) drug therapy: Secondary | ICD-10-CM | POA: Insufficient documentation

## 2013-07-08 DIAGNOSIS — Z01812 Encounter for preprocedural laboratory examination: Secondary | ICD-10-CM | POA: Insufficient documentation

## 2013-07-08 DIAGNOSIS — Y831 Surgical operation with implant of artificial internal device as the cause of abnormal reaction of the patient, or of later complication, without mention of misadventure at the time of the procedure: Secondary | ICD-10-CM | POA: Insufficient documentation

## 2013-07-08 DIAGNOSIS — Z01810 Encounter for preprocedural cardiovascular examination: Secondary | ICD-10-CM | POA: Insufficient documentation

## 2013-07-08 DIAGNOSIS — I10 Essential (primary) hypertension: Secondary | ICD-10-CM | POA: Insufficient documentation

## 2013-07-08 DIAGNOSIS — X58XXXA Exposure to other specified factors, initial encounter: Secondary | ICD-10-CM | POA: Insufficient documentation

## 2013-07-08 DIAGNOSIS — Z8673 Personal history of transient ischemic attack (TIA), and cerebral infarction without residual deficits: Secondary | ICD-10-CM | POA: Insufficient documentation

## 2013-07-08 DIAGNOSIS — G609 Hereditary and idiopathic neuropathy, unspecified: Secondary | ICD-10-CM | POA: Insufficient documentation

## 2013-07-08 DIAGNOSIS — Z794 Long term (current) use of insulin: Secondary | ICD-10-CM | POA: Insufficient documentation

## 2013-07-08 DIAGNOSIS — S82202A Unspecified fracture of shaft of left tibia, initial encounter for closed fracture: Secondary | ICD-10-CM

## 2013-07-08 DIAGNOSIS — Z01818 Encounter for other preprocedural examination: Secondary | ICD-10-CM | POA: Insufficient documentation

## 2013-07-08 DIAGNOSIS — S82202D Unspecified fracture of shaft of left tibia, subsequent encounter for closed fracture with routine healing: Secondary | ICD-10-CM

## 2013-07-08 DIAGNOSIS — E119 Type 2 diabetes mellitus without complications: Secondary | ICD-10-CM | POA: Insufficient documentation

## 2013-07-08 DIAGNOSIS — T847XXA Infection and inflammatory reaction due to other internal orthopedic prosthetic devices, implants and grafts, initial encounter: Secondary | ICD-10-CM | POA: Insufficient documentation

## 2013-07-08 DIAGNOSIS — E785 Hyperlipidemia, unspecified: Secondary | ICD-10-CM | POA: Insufficient documentation

## 2013-07-08 DIAGNOSIS — S82209A Unspecified fracture of shaft of unspecified tibia, initial encounter for closed fracture: Secondary | ICD-10-CM | POA: Insufficient documentation

## 2013-07-08 DIAGNOSIS — E039 Hypothyroidism, unspecified: Secondary | ICD-10-CM | POA: Insufficient documentation

## 2013-07-08 HISTORY — DX: Unspecified osteoarthritis, unspecified site: M19.90

## 2013-07-08 HISTORY — DX: Low back pain, unspecified: M54.50

## 2013-07-08 HISTORY — DX: Transient cerebral ischemic attack, unspecified: G45.9

## 2013-07-08 HISTORY — DX: Malignant (primary) neoplasm, unspecified: C80.1

## 2013-07-08 HISTORY — DX: Low back pain: M54.5

## 2013-07-08 HISTORY — DX: Hypothyroidism, unspecified: E03.9

## 2013-07-08 HISTORY — PX: EXTERNAL FIXATION REMOVAL: SHX5040

## 2013-07-08 HISTORY — DX: Other chronic pain: G89.29

## 2013-07-08 HISTORY — DX: Dysthymic disorder: F34.1

## 2013-07-08 HISTORY — PX: CAST APPLICATION: SHX380

## 2013-07-08 LAB — CBC
Hemoglobin: 9.6 g/dL — ABNORMAL LOW (ref 12.0–15.0)
MCH: 28.1 pg (ref 26.0–34.0)
MCHC: 32.4 g/dL (ref 30.0–36.0)
MCV: 86.5 fL (ref 78.0–100.0)
RBC: 3.42 MIL/uL — ABNORMAL LOW (ref 3.87–5.11)

## 2013-07-08 LAB — BASIC METABOLIC PANEL
CO2: 28 mEq/L (ref 19–32)
Calcium: 9.3 mg/dL (ref 8.4–10.5)
Creatinine, Ser: 1.1 mg/dL (ref 0.50–1.10)
GFR calc non Af Amer: 49 mL/min — ABNORMAL LOW (ref >90)
Glucose, Bld: 143 mg/dL — ABNORMAL HIGH (ref 70–99)

## 2013-07-08 SURGERY — REMOVAL, EXTERNAL FIXATION DEVICE, LOWER EXTREMITY
Anesthesia: General | Site: Leg Lower | Laterality: Left | Wound class: Dirty or Infected

## 2013-07-08 MED ORDER — LACTATED RINGERS IV SOLN
INTRAVENOUS | Status: DC
  Administered 2013-07-08: 20 mL/h via INTRAVENOUS

## 2013-07-08 MED ORDER — FENTANYL CITRATE 0.05 MG/ML IJ SOLN
INTRAMUSCULAR | Status: DC | PRN
  Administered 2013-07-08: 50 ug via INTRAVENOUS

## 2013-07-08 MED ORDER — 0.9 % SODIUM CHLORIDE (POUR BTL) OPTIME
TOPICAL | Status: DC | PRN
  Administered 2013-07-08: 1000 mL

## 2013-07-08 MED ORDER — KETOROLAC TROMETHAMINE 30 MG/ML IJ SOLN
15.0000 mg | Freq: Once | INTRAMUSCULAR | Status: AC | PRN
  Administered 2013-07-08: 30 mg via INTRAVENOUS

## 2013-07-08 MED ORDER — OXYCODONE HCL 5 MG/5ML PO SOLN: 5.0000 mg | Freq: Once | ORAL | Status: AC | PRN

## 2013-07-08 MED ORDER — CHLORHEXIDINE GLUCONATE 4 % EX LIQD: 60.0000 mL | Freq: Once | CUTANEOUS | Status: DC

## 2013-07-08 MED ORDER — ATROPINE SULFATE 1 MG/ML IJ SOLN
INTRAMUSCULAR | Status: DC | PRN
  Administered 2013-07-08 (×2): 0.2 mg via INTRAVENOUS

## 2013-07-08 MED ORDER — VANCOMYCIN HCL IN DEXTROSE 1-5 GM/200ML-% IV SOLN: 1000.0000 mg | INTRAVENOUS | Status: DC

## 2013-07-08 MED ORDER — ONDANSETRON HCL 4 MG/2ML IJ SOLN: 4.0000 mg | Freq: Once | INTRAMUSCULAR | Status: DC | PRN

## 2013-07-08 MED ORDER — LACTATED RINGERS IV SOLN
INTRAVENOUS | Status: DC | PRN
  Administered 2013-07-08: 11:00:00 via INTRAVENOUS

## 2013-07-08 MED ORDER — OXYCODONE HCL 5 MG PO TABS
5.0000 mg | ORAL_TABLET | Freq: Once | ORAL | Status: AC | PRN
  Administered 2013-07-08: 5 mg via ORAL

## 2013-07-08 MED ORDER — KETOROLAC TROMETHAMINE 30 MG/ML IJ SOLN
INTRAMUSCULAR | Status: AC
  Filled 2013-07-08: qty 1

## 2013-07-08 MED ORDER — OXYCODONE HCL 5 MG PO TABS
ORAL_TABLET | ORAL | Status: AC
  Filled 2013-07-08: qty 1

## 2013-07-08 MED ORDER — GLYCOPYRROLATE 0.2 MG/ML IJ SOLN
INTRAMUSCULAR | Status: DC | PRN
  Administered 2013-07-08: 0.2 mg via INTRAVENOUS

## 2013-07-08 MED ORDER — FENTANYL CITRATE 0.05 MG/ML IJ SOLN
INTRAMUSCULAR | Status: AC
  Filled 2013-07-08: qty 2

## 2013-07-08 MED ORDER — SODIUM CHLORIDE 0.9 % IV SOLN
INTRAVENOUS | Status: DC | PRN
  Administered 2013-07-08: 11:00:00 via INTRAVENOUS

## 2013-07-08 MED ORDER — FENTANYL CITRATE 0.05 MG/ML IJ SOLN
25.0000 ug | INTRAMUSCULAR | Status: DC | PRN
  Administered 2013-07-08: 50 ug via INTRAVENOUS

## 2013-07-08 MED ORDER — PROPOFOL 10 MG/ML IV BOLUS
INTRAVENOUS | Status: DC | PRN
  Administered 2013-07-08: 20 mg via INTRAVENOUS
  Administered 2013-07-08: 120 mg via INTRAVENOUS

## 2013-07-08 MED ORDER — VANCOMYCIN HCL 10 G IV SOLR
1500.0000 mg | INTRAVENOUS | Status: AC
  Administered 2013-07-08: 1500 mg via INTRAVENOUS
  Filled 2013-07-08: qty 1500

## 2013-07-08 MED ORDER — VANCOMYCIN HCL IN DEXTROSE 1-5 GM/200ML-% IV SOLN
INTRAVENOUS | Status: AC
  Filled 2013-07-08: qty 200

## 2013-07-08 MED ORDER — ONDANSETRON HCL 4 MG/2ML IJ SOLN
INTRAMUSCULAR | Status: DC | PRN
  Administered 2013-07-08: 4 mg via INTRAVENOUS

## 2013-07-08 MED ORDER — LIDOCAINE HCL (CARDIAC) 20 MG/ML IV SOLN
INTRAVENOUS | Status: DC | PRN
  Administered 2013-07-08: 50 mg via INTRAVENOUS
  Administered 2013-07-08: 40 mg via INTRAVENOUS

## 2013-07-08 SURGICAL SUPPLY — 65 items
BANDAGE ELASTIC 4 VELCRO ST LF (GAUZE/BANDAGES/DRESSINGS) IMPLANT
BANDAGE ELASTIC 6 VELCRO ST LF (GAUZE/BANDAGES/DRESSINGS) IMPLANT
BANDAGE ESMARK 6X9 LF (GAUZE/BANDAGES/DRESSINGS) IMPLANT
BANDAGE GAUZE ELAST BULKY 4 IN (GAUZE/BANDAGES/DRESSINGS) ×4 IMPLANT
BNDG COHESIVE 6X5 TAN STRL LF (GAUZE/BANDAGES/DRESSINGS) IMPLANT
BNDG ESMARK 6X9 LF (GAUZE/BANDAGES/DRESSINGS)
BRUSH SCRUB DISP (MISCELLANEOUS) ×4 IMPLANT
CLEANER TIP ELECTROSURG 2X2 (MISCELLANEOUS) IMPLANT
CLOTH BEACON ORANGE TIMEOUT ST (SAFETY) ×2 IMPLANT
COVER SURGICAL LIGHT HANDLE (MISCELLANEOUS) ×2 IMPLANT
CUFF TOURNIQUET SINGLE 18IN (TOURNIQUET CUFF) IMPLANT
CUFF TOURNIQUET SINGLE 24IN (TOURNIQUET CUFF) IMPLANT
CUFF TOURNIQUET SINGLE 34IN LL (TOURNIQUET CUFF) IMPLANT
DRAPE C-ARM 42X72 X-RAY (DRAPES) IMPLANT
DRAPE C-ARMOR (DRAPES) IMPLANT
DRAPE OEC MINIVIEW 54X84 (DRAPES) IMPLANT
DRAPE U-SHAPE 47X51 STRL (DRAPES) IMPLANT
DRSG ADAPTIC 3X8 NADH LF (GAUZE/BANDAGES/DRESSINGS) IMPLANT
DRSG MEPITEL 4X7.2 (GAUZE/BANDAGES/DRESSINGS) ×2 IMPLANT
ELECT REM PT RETURN 9FT ADLT (ELECTROSURGICAL) ×2
ELECTRODE REM PT RTRN 9FT ADLT (ELECTROSURGICAL) ×1 IMPLANT
EVACUATOR 1/8 PVC DRAIN (DRAIN) IMPLANT
GLOVE BIO SURGEON STRL SZ7.5 (GLOVE) ×2 IMPLANT
GLOVE BIO SURGEON STRL SZ8 (GLOVE) ×2 IMPLANT
GLOVE BIOGEL PI IND STRL 7.5 (GLOVE) ×1 IMPLANT
GLOVE BIOGEL PI IND STRL 8 (GLOVE) ×1 IMPLANT
GLOVE BIOGEL PI INDICATOR 7.5 (GLOVE) ×1
GLOVE BIOGEL PI INDICATOR 8 (GLOVE) ×1
GOWN PREVENTION PLUS XLARGE (GOWN DISPOSABLE) ×2 IMPLANT
GOWN STRL NON-REIN LRG LVL3 (GOWN DISPOSABLE) ×4 IMPLANT
KIT BASIN OR (CUSTOM PROCEDURE TRAY) ×2 IMPLANT
KIT ROOM TURNOVER OR (KITS) ×2 IMPLANT
MANIFOLD NEPTUNE II (INSTRUMENTS) ×2 IMPLANT
NEEDLE 22X1 1/2 (OR ONLY) (NEEDLE) IMPLANT
NS IRRIG 1000ML POUR BTL (IV SOLUTION) ×2 IMPLANT
PACK ORTHO EXTREMITY (CUSTOM PROCEDURE TRAY) ×2 IMPLANT
PAD ARMBOARD 7.5X6 YLW CONV (MISCELLANEOUS) ×4 IMPLANT
PADDING CAST ABS 4INX4YD NS (CAST SUPPLIES) ×1
PADDING CAST ABS 6INX4YD NS (CAST SUPPLIES) ×2
PADDING CAST ABS COTTON 4X4 ST (CAST SUPPLIES) ×1 IMPLANT
PADDING CAST ABS COTTON 6X4 NS (CAST SUPPLIES) ×2 IMPLANT
PADDING CAST COTTON 6X4 STRL (CAST SUPPLIES) IMPLANT
SCOTCHCAST PLUS 4X4 WHITE (CAST SUPPLIES) ×4 IMPLANT
SPLINT PLASTER CAST XFAST 5X30 (CAST SUPPLIES) ×1 IMPLANT
SPLINT PLASTER XFAST SET 5X30 (CAST SUPPLIES) ×1
SPONGE GAUZE 4X4 12PLY (GAUZE/BANDAGES/DRESSINGS) ×2 IMPLANT
SPONGE LAP 18X18 X RAY DECT (DISPOSABLE) ×2 IMPLANT
SPONGE SCRUB IODOPHOR (GAUZE/BANDAGES/DRESSINGS) IMPLANT
STAPLER VISISTAT 35W (STAPLE) IMPLANT
STOCKINETTE IMPERVIOUS LG (DRAPES) IMPLANT
STRIP CLOSURE SKIN 1/2X4 (GAUZE/BANDAGES/DRESSINGS) IMPLANT
SUCTION FRAZIER TIP 10 FR DISP (SUCTIONS) IMPLANT
SUT ETHILON 3 0 PS 1 (SUTURE) IMPLANT
SUT PDS AB 2-0 CT1 27 (SUTURE) IMPLANT
SUT VIC AB 0 CT1 27 (SUTURE)
SUT VIC AB 0 CT1 27XBRD ANBCTR (SUTURE) IMPLANT
SUT VIC AB 2-0 CT1 27 (SUTURE)
SUT VIC AB 2-0 CT1 TAPERPNT 27 (SUTURE) IMPLANT
SYR CONTROL 10ML LL (SYRINGE) IMPLANT
TOWEL OR 17X24 6PK STRL BLUE (TOWEL DISPOSABLE) ×4 IMPLANT
TOWEL OR 17X26 10 PK STRL BLUE (TOWEL DISPOSABLE) ×4 IMPLANT
TUBE CONNECTING 12X1/4 (SUCTIONS) ×2 IMPLANT
UNDERPAD 30X30 INCONTINENT (UNDERPADS AND DIAPERS) ×2 IMPLANT
WATER STERILE IRR 1000ML POUR (IV SOLUTION) IMPLANT
YANKAUER SUCT BULB TIP NO VENT (SUCTIONS) ×2 IMPLANT

## 2013-07-08 NOTE — Preoperative (Signed)
Beta Blockers   Reason not to administer Beta Blockers:Not Applicable 

## 2013-07-08 NOTE — H&P (Signed)
I saw and examined the patient with Mr. Renae Fickle, communicating the findings and plan noted above.  I discussed with the patient the risks and benefits of surgery for her left tibia loss of reduction, including the possibility of infection, nerve injury, vessel injury, wound breakdown, arthritis, malunion, nonunion, symptomatic hardware, DVT/ PE, loss of motion, decubitus or pressure ulcerations, and need for further surgery among others.  We also specifically discussed the elevated risk of soft tissue breakdown that could lead to amputation.  She understood these risks and wished to proceed.  Myrene Galas, MD [facility] [phone] (p)

## 2013-07-08 NOTE — Anesthesia Preprocedure Evaluation (Signed)
Anesthesia Evaluation  Patient identified by MRN, date of birth, ID band Patient awake    Reviewed: Allergy & Precautions, H&P , NPO status   Airway Mallampati: II      Dental  (+) Edentulous Upper and Edentulous Lower   Pulmonary  breath sounds clear to auscultation        Cardiovascular Rhythm:Regular     Neuro/Psych    GI/Hepatic   Endo/Other    Renal/GU      Musculoskeletal   Abdominal (+) + obese,   Peds  Hematology   Anesthesia Other Findings   Reproductive/Obstetrics                           Anesthesia Physical Anesthesia Plan  ASA: III  Anesthesia Plan: General   Post-op Pain Management:    Induction: Intravenous  Airway Management Planned: LMA  Additional Equipment:   Intra-op Plan:   Post-operative Plan:   Informed Consent: I have reviewed the patients History and Physical, chart, labs and discussed the procedure including the risks, benefits and alternatives for the proposed anesthesia with the patient or authorized representative who has indicated his/her understanding and acceptance.     Plan Discussed with: CRNA and Anesthesiologist  Anesthesia Plan Comments: (Fracture L ankle with external fixator and non-reduction Type 2 DM glucose 143 Htn H/O TIA on Plavix Obesity  Plan GA with LMA)        Anesthesia Quick Evaluation

## 2013-07-08 NOTE — H&P (Signed)
[facility] H&P  Chief Complaint: loss of reduction L distal tibial shaft fx HPI:   72 year old female status post ground level fall on 06/16/2013 sustained severe distal third tibial shaft and fibular shaft fractures. She was seen and evaluated at [facility]. She was taken to the operating room by orthopedics there for application of spanning external fixator. Patient was referred to the orthopedic trauma specialists given the complex nature of her injury. Patient was seen in our office on 06/29/2013. We didn't to see her sooner but there were canceled and missed appointments. She was again seen in our office for her first evaluation. She was in a spanning external fixator for her injury. Followup films are office demonstrated satisfactory alignment. She did have a large pressure sore on her heel. She did present with fracture as well as a splint on. I given the presence of the pressure sore we did remove the splint and ordered a PRAFO boot to help alleviate some of the pressure. Due to her severe skin swelling we are unable to plan for ORIF. We thought that that utilizing the fixator would be the best treatment course for definitive treatment for this particular patient. Patient presented back to our office on 07/06/2013 subsequent films demonstrate loss of reduction of her fracture.  Patient now presents for removal of ex fix, and possible conversion to a different ex fix versus removal of ex fix and casting. Again her soft tissue does cause somewhat of a concern I given her numerous medical comorbidities and limited activity level.    Past Medical History  Diagnosis Date  . Diabetes mellitus   . Hyperlipidemia   . Hypertension   . Hypothyroidism   . Chronic pain     Hx: of  . Lumbago     Hx: of  . Arthritis     Hx: of Osteoarthritis  . Dysthymic disorder     Hx; of  . TIA (transient ischemic attack)     Hx: of w/o residual    Past Surgical History   Procedure Laterality Date  . Leg surgery      after fracture of left femur - had metal plates placed    History reviewed. No pertinent family history. Social History:  reports that she quit smoking about 16 years ago. She has never used smokeless tobacco. She reports that she does not drink alcohol or use illicit drugs.  Allergies:  Allergies  Allergen Reactions  . Augmentin [Amoxicillin-Pot Clavulanate]     Unknown reaction  . Avandia [Rosiglitazone]     Unknown reaction  . Ciprofloxacin Hcl   . Levaquin [Levofloxacin In D5w]     Unknown reaction  . Penicillins Cross Reactors   . Sulfa Drugs Cross Reactors   . Tylenol [Acetaminophen]     Unknown reaction  . Zithromax [Azithromycin]     Unknown reaction    Medications Prior to Admission  Medication Sig Dispense Refill  . ALPRAZolam (XANAX) 0.5 MG tablet Take 0.5 mg by mouth at bedtime as needed.        Marland Kitchen amLODipine (NORVASC) 5 MG tablet Take 5 mg by mouth daily.        . Choline Fenofibrate (TRILIPIX) 135 MG capsule Take 1 capsule (135 mg total) by mouth daily.  30 capsule  2  . clopidogrel (PLAVIX) 75 MG tablet Take 1 tablet (75 mg total) by mouth daily.  90 tablet  3  . esomeprazole (NEXIUM) 40 MG capsule Take 1 capsule (40 mg  total) by mouth daily.  30 capsule  3  . fenofibrate (TRICOR) 145 MG tablet Take 145 mg by mouth daily.        . fentaNYL (DURAGESIC - DOSED MCG/HR) 50 MCG/HR Place 1 patch (50 mcg total) onto the skin every 3 (three) days.  5 patch  0  . fexofenadine (ALLEGRA) 180 MG tablet Take 1 tablet (180 mg total) by mouth daily.  30 tablet  4  . furosemide (LASIX) 80 MG tablet Take 0.5 tablets (40 mg total) by mouth daily.  30 tablet  4  . gabapentin (NEURONTIN) 300 MG capsule Take 1 capsule (300 mg total) by mouth 4 (four) times daily.  120 capsule  3  . insulin glargine (LANTUS) 100 UNIT/ML injection Inject 63 Units into the skin at bedtime.  10 mL  3  . levothyroxine (SYNTHROID, LEVOTHROID) 112 MCG tablet  Take 1 tablet (112 mcg total) by mouth daily.  90 tablet  3  . metFORMIN (GLUCOPHAGE) 500 MG tablet Take 500 mg by mouth daily as needed.        . mirtazapine (REMERON) 15 MG tablet Take 1 tablet (15 mg total) by mouth at bedtime.  90 tablet  1  . Misc. Devices (TRANSFER BENCH) MISC As directed DX: DJD, pelvic fracture  1 each  0  . Oxycodone HCl 10 MG TABS Take 1 tablet (10 mg total) by mouth every 4 (four) hours as needed.  90 each  0  . potassium chloride (KLOR-CON) 10 MEQ CR tablet Take 10 mEq by mouth daily.        . simvastatin (ZOCOR) 40 MG tablet Take 1 tablet (40 mg total) by mouth at bedtime.  30 tablet  4  . Syringe/Needle, Disp, (B-D ECLIPSE SYRINGE) 30G X 1/2" 1 ML MISC 1 each by Does not apply route daily.  100 each  3  . valACYclovir (VALTREX) 500 MG tablet Take 1 tablet (500 mg total) by mouth daily. May take bid prn outbreak then resume qd dosing  180 tablet  3  . venlafaxine XR (EFFEXOR-XR) 150 MG 24 hr capsule Take 1 capsule (150 mg total) by mouth daily.  90 capsule  0  . zolpidem (AMBIEN) 10 MG tablet Take 10 mg by mouth at bedtime as needed.          No results found for this or any previous visit (from the past 48 hour(s)). No results found.  Review of Systems  Constitutional: Negative for fever and chills.  Eyes: Negative for blurred vision.  Respiratory: Negative for shortness of breath and wheezing.   Cardiovascular: Negative for chest pain and palpitations.  Gastrointestinal: Negative for nausea, vomiting and abdominal pain.  Genitourinary: Negative for dysuria.  Musculoskeletal:       L ankle pain   Neurological: Negative for tingling, sensory change and headaches.    Height 5\' 5"  (1.651 m), weight 113.399 kg (250 lb). Physical Exam  Vitals reviewed. Constitutional: She is oriented to person, place, and time. She appears well-developed and well-nourished. She is cooperative.  HENT:  Head: Normocephalic and atraumatic.  Neck: Normal range of motion.   Cardiovascular: Normal rate and regular rhythm.   Respiratory: Effort normal and breath sounds normal.  GI: Soft. Bowel sounds are normal. There is no tenderness.  Musculoskeletal:  Left lower extremity   Spanning ex-fix to left ankle   Pin sites are stable   Skin significant swelling still   No appreciable wrinkling of the skin   Baseline peripheral  neuropathy, deep peroneal nerve, superficial peroneal nerve, tibial nerve sensory functions are grossly intact   EHL and FHL motor functions grossly intact as well   No pain with passive stretching   Compartments are soft   Extremity is warm   Palpable DP pulse  Neurological: She is alert and oriented to person, place, and time.  Skin: Skin is warm.  Pressure sore at the left heel  Psychiatric: She has a normal mood and affect. Her behavior is normal.      Assessment/Plan  72 year old female status post fall with complex distal third tibial shaft and fibular shaft fracture status post external fixation at [facility] with loss of reduction 3 and half weeks post injury  To the OR for revision of ex-fix versus ex-fix removal and placement into a cast If we place patient in cast will likely return in several weeks for a revision to an IM nail to give her a pin tract holiday Patient will likely be discharged to the nursing home after procedure today To continue on Lovenox for DVT and PE prophylaxis and we will write for new prescriptions for such The patient does have significant risk for persistent nonunion and deep infection given her medical comorbidities including her insulin-dependent diabetes and peripheral neuropathy  Mearl Latin, PA-C [facility] [phone] (P) 07/08/2013, 8:17 AM

## 2013-07-08 NOTE — Transfer of Care (Signed)
Immediate Anesthesia Transfer of Care Note  Patient: Jeanette Hester  Procedure(s) Performed: Procedure(s): REMOVAL EXTERNAL FIXATION LEFT LEG/CURRETAGE of pin sites (Left) LEFT SHORT LEG CAST APPLICATION (Left)  Patient Location: PACU  Anesthesia Type:General  Level of Consciousness: awake, alert , oriented and sedated  Airway & Oxygen Therapy: Patient Spontanous Breathing and Patient connected to nasal cannula oxygen  Post-op Assessment: Report given to PACU RN, Post -op Vital signs reviewed and stable and Patient moving all extremities  Post vital signs: Reviewed and stable  Complications: No apparent anesthesia complications

## 2013-07-08 NOTE — Brief Op Note (Signed)
07/08/2013  12:22 PM  PATIENT:  Jeanette Hester  72 y.o. female  PRE-OPERATIVE DIAGNOSIS:  LEFT TIBIA/FIBULAR FRACTURE  POST-OPERATIVE DIAGNOSIS:  LEFT TIBIA/FIBULAR FRACTURE  PROCEDURE:  Procedure(s): 1. REMOVAL EXTERNAL FIXATION LEFT LEG 2. CURRETAGE of pin sites (Left) with debridement of bone 3. LEFT SHORT LEG CAST APPLICATION (Left)  SURGEON:  Surgeon(s) and Role:    * Budd Palmer, MD - Primary  PHYSICIAN ASSISTANT: Montez Morita, [facility]  ANESTHESIA:   general  EBL:  Total I/O In: 500 [I.V.:500] Out: -   BLOOD ADMINISTERED:none  DRAINS: none   LOCAL MEDICATIONS USED:  NONE  SPECIMEN:  No Specimen  DISPOSITION OF SPECIMEN:  N/A  COUNTS:  YES  TOURNIQUET:  * No tourniquets in log *  DICTATION: .Other Dictation: Dictation Number [id number]  PLAN OF CARE: Discharge to home after PACU  PATIENT DISPOSITION:  PACU - hemodynamically stable.   Delay start of Pharmacological VTE agent (>24hrs) due to surgical blood loss or risk of bleeding: no

## 2013-07-08 NOTE — Anesthesia Postprocedure Evaluation (Signed)
  Anesthesia Post-op Note  Patient: Jeanette Hester  Procedure(s) Performed: Procedure(s): REMOVAL EXTERNAL FIXATION LEFT LEG/CURRETAGE of pin sites (Left) LEFT SHORT LEG CAST APPLICATION (Left)  Patient Location: PACU  Anesthesia Type:General  Level of Consciousness: awake, alert  and oriented  Airway and Oxygen Therapy: Patient Spontanous Breathing and Patient connected to nasal cannula oxygen  Post-op Pain: mild  Post-op Assessment: Post-op Vital signs reviewed, Patient's Cardiovascular Status Stable, Respiratory Function Stable, Patent Airway and Pain level controlled  Post-op Vital Signs: stable  Complications: No apparent anesthesia complications

## 2013-07-09 NOTE — Op Note (Signed)
NAMEFLOREE, ZUNIGA                ACCOUNT NO.:  [id number]  MEDICAL RECORD NO.:  [id number]  LOCATION:  [facility]                         FACILITY:  [facility]  PHYSICIAN:  Doralee Albino. Carola Frost, M.D. DATE OF BIRTH:  09-14-1941  DATE OF PROCEDURE:  07/08/2013 DATE OF DISCHARGE:  07/08/2013                              OPERATIVE REPORT   PREOPERATIVE DIAGNOSES: 1. Left tib-fib fracture status post external fixation. 2. Pin tract infection and loosening tibia and calcaneus.  POSTOPERATIVE DIAGNOSES: 1. Left tib-fib fracture status post external fixation. 2. Pin tract infection and loosening tibia and calcaneus.  PROCEDURES: 1. Removal of external fixator under anesthesia, left leg. 2. Debridement of ulcerated pin sites including bone, of tibia and calcaneus. 3. Application of short leg splint. 4. Manipulation and closed reduction of tibia and fibula fractures.  SURGEON:  Doralee Albino. Carola Frost, M.D.  ASSISTANT:  Mearl Latin, P.A-C.  ANESTHESIA:  General.  COMPLICATIONS:  None.  ESTIMATED BLOOD LOSS:  Minimal.  DISPOSITION:  To PACU.  CONDITION:  Stable.  BRIEF INDICATION FOR PROCEDURE:  Jeanette Hester is a 72 year old female with multiple medical problems who sustained a comminuted distal tibia and fibular fracture treated at an outside institution with closed reduction and external fixation.  She was referred for followup and has had persistent soft tissue swelling and on most recent evaluation was noted to have acute apex anterior deformity of her fracture with some loosening of her pins and drainage particularly at the medial calcaneus. We did discuss with her the risks and benefits of revision external fixation versus internal fixation versus removal of the fixator use of a so called pin tract holiday for 2 weeks and then possible return to the OR for intramedullary nailing, once this could be safely performed.  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  She did wish to proceed with surgery understanding those risks, as well as a chance of heart attack, stroke, infection, neurovascular injury, malunion, nonunion, need for further surgery.  BRIEF SUMMARY OF PROCEDURE:  The patient received vancomycin preoperatively, was taken to the operating room where general anesthesia was induced.  Her fixator was then removed from the left leg, as again there was some loosening that was clearly observable.  After removal of the fixator and pins, curette was used to debride the soft tissue envelope and then this was carried down into the near cortex and irrigated thoroughly.  Pin tract was curettaged as well subsequent to this, and very thorough chlorhexidine scrub brush was performed both before, and then after.  This debridement followed by aggressive irrigation including through and through wash of the calcaneal pin site. Sterile gently compressive dressing was then applied using Mepitel gauze, Kerlix, and then Webril.  Closed reduction maneuver was performed to better align the tibia and fibula fractures to reduce apex anterior angulation.  Because of her decubitus calcaneal ulcer that was present on her initial follow up to the office, a pressure relieving bolster was placed about the heel for application of the short-leg splint and then this was removed after the splint had  been applied.  Thus, this area was then completely relieved.  Montez Morita, PA- C did assist throughout the procedure and was absolutely necessary for application of the specialized splint particularly given the patient's elevated BMI.  PROGNOSIS:  [name] remains at very high risk for multiple complications including soft tissue breakdown which could lead to amputation.  Her medical problems further increase the chance of poor bone nutrition and regrowth which could result in persistent  nonunion. We will plan to see her back for removal of the splint in 10-14 days with probable conversion to a cast if there is evidence of further bone healing versus scheduling of intramedullary nailing.     Doralee Albino. Carola Frost, M.D.     [facility]MED[facility]  D:  07/08/2013  T:  07/09/2013  Job:  [id number]

## 2013-07-11 ENCOUNTER — Encounter (HOSPITAL_COMMUNITY): Payer: Self-pay | Admitting: Orthopedic Surgery

## 2014-01-12 ENCOUNTER — Ambulatory Visit (INDEPENDENT_AMBULATORY_CARE_PROVIDER_SITE_OTHER): Payer: Medicare Other | Admitting: Podiatrist

## 2014-01-12 DIAGNOSIS — E119 Type 2 diabetes mellitus without complications: Secondary | ICD-10-CM

## 2014-01-12 NOTE — Progress Notes (Signed)
Jeanette Hester fell while trying to transfer from her wheelchair to the treatment chair today.  She was uninjured and did not lose consciousness.  We had to call EMT to get her up and back into her wheelchair.  We offered treatment on her feet however, she states her toenails were recently trimmed where she lives and they do not need a trim.  She would like to order diabetic shoes however we haven't done any of the paperwork for her yet to be able to measure her today.  We will submit the paperwork for the shoes and call her to make an appointment for measurement.   No treatment was rendered at today's visit.

## 2014-02-02 ENCOUNTER — Inpatient Hospital Stay: Payer: Self-pay | Admitting: Family Medicine

## 2014-02-02 LAB — CBC WITH DIFFERENTIAL/PLATELET
Basophil #: 0.1 10*3/uL (ref 0.0–0.1)
Basophil %: 1.1 %
EOS ABS: 0.2 10*3/uL (ref 0.0–0.7)
EOS PCT: 5 %
HCT: 30.4 % — AB (ref 35.0–47.0)
HGB: 9.7 g/dL — ABNORMAL LOW (ref 12.0–16.0)
LYMPHS ABS: 1 10*3/uL (ref 1.0–3.6)
Lymphocyte %: 19.7 %
MCH: 26 pg (ref 26.0–34.0)
MCHC: 31.8 g/dL — AB (ref 32.0–36.0)
MCV: 82 fL (ref 80–100)
Monocyte #: 0.3 x10 3/mm (ref 0.2–0.9)
Monocyte %: 5.5 %
Neutrophil #: 3.4 10*3/uL (ref 1.4–6.5)
Neutrophil %: 68.7 %
PLATELETS: 108 10*3/uL — AB (ref 150–440)
RBC: 3.72 10*6/uL — ABNORMAL LOW (ref 3.80–5.20)
RDW: 16.8 % — AB (ref 11.5–14.5)
WBC: 5 10*3/uL (ref 3.6–11.0)

## 2014-02-02 LAB — URINALYSIS, COMPLETE
BILIRUBIN, UR: NEGATIVE
Glucose,UR: >=500 mg/dL (ref 0–75)
Ketone: NEGATIVE
NITRITE: NEGATIVE
Ph: 5 (ref 4.5–8.0)
Protein: >=500
Specific Gravity: 1.016 (ref 1.003–1.030)
Squamous Epithelial: 2

## 2014-02-02 LAB — BASIC METABOLIC PANEL
Anion Gap: 6 — ABNORMAL LOW (ref 7–16)
BUN: 24 mg/dL — ABNORMAL HIGH (ref 7–18)
CREATININE: 1.68 mg/dL — AB (ref 0.60–1.30)
Calcium, Total: 8.6 mg/dL (ref 8.5–10.1)
Chloride: 109 mmol/L — ABNORMAL HIGH (ref 98–107)
Co2: 24 mmol/L (ref 21–32)
EGFR (African American): 35 — ABNORMAL LOW
GFR CALC NON AF AMER: 30 — AB
Glucose: 362 mg/dL — ABNORMAL HIGH (ref 65–99)
Osmolality: 296 (ref 275–301)
Potassium: 4.7 mmol/L (ref 3.5–5.1)
Sodium: 139 mmol/L (ref 136–145)

## 2014-02-02 LAB — TROPONIN I

## 2014-02-03 LAB — BASIC METABOLIC PANEL
Anion Gap: 9 (ref 7–16)
BUN: 25 mg/dL — ABNORMAL HIGH (ref 7–18)
CO2: 20 mmol/L — AB (ref 21–32)
Calcium, Total: 8 mg/dL — ABNORMAL LOW (ref 8.5–10.1)
Chloride: 115 mmol/L — ABNORMAL HIGH (ref 98–107)
Creatinine: 1.52 mg/dL — ABNORMAL HIGH (ref 0.60–1.30)
EGFR (African American): 39 — ABNORMAL LOW
EGFR (Non-African Amer.): 34 — ABNORMAL LOW
Glucose: 152 mg/dL — ABNORMAL HIGH (ref 65–99)
Osmolality: 294 (ref 275–301)
POTASSIUM: 4.5 mmol/L (ref 3.5–5.1)
SODIUM: 144 mmol/L (ref 136–145)

## 2014-02-03 LAB — CBC WITH DIFFERENTIAL/PLATELET
BASOS ABS: 0 10*3/uL (ref 0.0–0.1)
Basophil %: 1.1 %
EOS ABS: 0.2 10*3/uL (ref 0.0–0.7)
EOS PCT: 5.1 %
HCT: 25.5 % — ABNORMAL LOW (ref 35.0–47.0)
HGB: 8.3 g/dL — AB (ref 12.0–16.0)
Lymphocyte #: 1 10*3/uL (ref 1.0–3.6)
Lymphocyte %: 24.4 %
MCH: 26.3 pg (ref 26.0–34.0)
MCHC: 32.5 g/dL (ref 32.0–36.0)
MCV: 81 fL (ref 80–100)
Monocyte #: 0.4 x10 3/mm (ref 0.2–0.9)
Monocyte %: 8.7 %
NEUTROS ABS: 2.5 10*3/uL (ref 1.4–6.5)
Neutrophil %: 60.7 %
Platelet: 111 10*3/uL — ABNORMAL LOW (ref 150–440)
RBC: 3.15 10*6/uL — ABNORMAL LOW (ref 3.80–5.20)
RDW: 17.6 % — ABNORMAL HIGH (ref 11.5–14.5)
WBC: 4.1 10*3/uL (ref 3.6–11.0)

## 2014-02-03 LAB — HEPATIC FUNCTION PANEL A (ARMC)
ALBUMIN: 2.5 g/dL — AB (ref 3.4–5.0)
AST: 18 U/L (ref 15–37)
Alkaline Phosphatase: 178 U/L — ABNORMAL HIGH
BILIRUBIN TOTAL: 0.2 mg/dL (ref 0.2–1.0)
SGPT (ALT): 9 U/L — ABNORMAL LOW (ref 12–78)
Total Protein: 6.1 g/dL — ABNORMAL LOW (ref 6.4–8.2)

## 2014-02-03 LAB — TROPONIN I: Troponin-I: <0.02 ng/mL

## 2014-02-03 LAB — CK-MB
CK-MB: 1 ng/mL (ref 0.5–3.6)
CK-MB: 1 ng/mL (ref 0.5–3.6)

## 2014-02-03 LAB — TSH: Thyroid Stimulating Horm: 3.3 u[IU]/mL

## 2014-02-04 LAB — BASIC METABOLIC PANEL
Anion Gap: 1 — ABNORMAL LOW (ref 7–16)
BUN: 21 mg/dL — ABNORMAL HIGH (ref 7–18)
CALCIUM: 8.2 mg/dL — AB (ref 8.5–10.1)
CO2: 29 mmol/L (ref 21–32)
CREATININE: 1.31 mg/dL — AB (ref 0.60–1.30)
Chloride: 111 mmol/L — ABNORMAL HIGH (ref 98–107)
EGFR (African American): 47 — ABNORMAL LOW
EGFR (Non-African Amer.): 41 — ABNORMAL LOW
GLUCOSE: 242 mg/dL — AB (ref 65–99)
Osmolality: 292 (ref 275–301)
Potassium: 4.6 mmol/L (ref 3.5–5.1)
Sodium: 141 mmol/L (ref 136–145)

## 2014-02-05 LAB — URINE CULTURE

## 2014-02-14 ENCOUNTER — Inpatient Hospital Stay: Payer: Self-pay | Admitting: Internal Medicine

## 2014-02-14 LAB — COMPREHENSIVE METABOLIC PANEL
ALK PHOS: 203 U/L — AB
AST: 101 U/L — AB (ref 15–37)
Albumin: 2.6 g/dL — ABNORMAL LOW (ref 3.4–5.0)
Anion Gap: 6 — ABNORMAL LOW (ref 7–16)
BUN: 26 mg/dL — ABNORMAL HIGH (ref 7–18)
Bilirubin,Total: 0.6 mg/dL (ref 0.2–1.0)
CO2: 28 mmol/L (ref 21–32)
Calcium, Total: 8.8 mg/dL (ref 8.5–10.1)
Chloride: 105 mmol/L (ref 98–107)
Creatinine: 1.69 mg/dL — ABNORMAL HIGH (ref 0.60–1.30)
EGFR (African American): 35 — ABNORMAL LOW
GFR CALC NON AF AMER: 30 — AB
Glucose: 107 mg/dL — ABNORMAL HIGH (ref 65–99)
Osmolality: 283 (ref 275–301)
Potassium: 4.9 mmol/L (ref 3.5–5.1)
SGPT (ALT): 47 U/L (ref 12–78)
SODIUM: 139 mmol/L (ref 136–145)
Total Protein: 6.5 g/dL (ref 6.4–8.2)

## 2014-02-14 LAB — URINALYSIS, COMPLETE
Bilirubin,UR: NEGATIVE
Glucose,UR: NEGATIVE mg/dL (ref 0–75)
Ketone: NEGATIVE
Nitrite: NEGATIVE
Ph: 6 (ref 4.5–8.0)
Protein: 100
SPECIFIC GRAVITY: 1.01 (ref 1.003–1.030)
SQUAMOUS EPITHELIAL: NONE SEEN

## 2014-02-14 LAB — CBC
HCT: 31.3 % — AB (ref 35.0–47.0)
HGB: 10.5 g/dL — ABNORMAL LOW (ref 12.0–16.0)
MCH: 26.9 pg (ref 26.0–34.0)
MCHC: 33.4 g/dL (ref 32.0–36.0)
MCV: 81 fL (ref 80–100)
Platelet: 152 10*3/uL (ref 150–440)
RBC: 3.89 10*6/uL (ref 3.80–5.20)
RDW: 17.8 % — ABNORMAL HIGH (ref 11.5–14.5)
WBC: 6.9 10*3/uL (ref 3.6–11.0)

## 2014-02-14 LAB — PROTIME-INR
INR: 1
PROTHROMBIN TIME: 12.7 s (ref 11.5–14.7)

## 2014-02-14 LAB — TROPONIN I

## 2014-02-15 LAB — CBC WITH DIFFERENTIAL/PLATELET
BASOS ABS: 0.1 10*3/uL (ref 0.0–0.1)
Basophil %: 0.7 %
EOS ABS: 0 10*3/uL (ref 0.0–0.7)
Eosinophil %: 0.1 %
HCT: 27.6 % — ABNORMAL LOW (ref 35.0–47.0)
HGB: 9.2 g/dL — ABNORMAL LOW (ref 12.0–16.0)
LYMPHS ABS: 0.5 10*3/uL — AB (ref 1.0–3.6)
LYMPHS PCT: 6.1 %
MCH: 26.9 pg (ref 26.0–34.0)
MCHC: 33.3 g/dL (ref 32.0–36.0)
MCV: 81 fL (ref 80–100)
MONOS PCT: 4.7 %
Monocyte #: 0.4 x10 3/mm (ref 0.2–0.9)
Neutrophil #: 6.7 10*3/uL — ABNORMAL HIGH (ref 1.4–6.5)
Neutrophil %: 88.4 %
PLATELETS: 129 10*3/uL — AB (ref 150–440)
RBC: 3.41 10*6/uL — ABNORMAL LOW (ref 3.80–5.20)
RDW: 17.3 % — ABNORMAL HIGH (ref 11.5–14.5)
WBC: 7.6 10*3/uL (ref 3.6–11.0)

## 2014-02-15 LAB — BASIC METABOLIC PANEL
Anion Gap: 5 — ABNORMAL LOW (ref 7–16)
BUN: 33 mg/dL — ABNORMAL HIGH (ref 7–18)
CHLORIDE: 105 mmol/L (ref 98–107)
Calcium, Total: 7.9 mg/dL — ABNORMAL LOW (ref 8.5–10.1)
Co2: 29 mmol/L (ref 21–32)
Creatinine: 1.89 mg/dL — ABNORMAL HIGH (ref 0.60–1.30)
EGFR (African American): 30 — ABNORMAL LOW
GFR CALC NON AF AMER: 26 — AB
GLUCOSE: 269 mg/dL — AB (ref 65–99)
Osmolality: 294 (ref 275–301)
POTASSIUM: 4.6 mmol/L (ref 3.5–5.1)
Sodium: 139 mmol/L (ref 136–145)

## 2014-02-16 LAB — CBC WITH DIFFERENTIAL/PLATELET
BASOS PCT: 1.2 %
Basophil #: 0.1 10*3/uL (ref 0.0–0.1)
Eosinophil #: 0.2 10*3/uL (ref 0.0–0.7)
Eosinophil %: 5.4 %
HCT: 27.9 % — AB (ref 35.0–47.0)
HGB: 9.1 g/dL — ABNORMAL LOW (ref 12.0–16.0)
LYMPHS ABS: 0.5 10*3/uL — AB (ref 1.0–3.6)
Lymphocyte %: 10.6 %
MCH: 26.3 pg (ref 26.0–34.0)
MCHC: 32.6 g/dL (ref 32.0–36.0)
MCV: 81 fL (ref 80–100)
MONOS PCT: 5.2 %
Monocyte #: 0.2 x10 3/mm (ref 0.2–0.9)
NEUTROS ABS: 3.3 10*3/uL (ref 1.4–6.5)
NEUTROS PCT: 77.6 %
Platelet: 136 10*3/uL — ABNORMAL LOW (ref 150–440)
RBC: 3.46 10*6/uL — ABNORMAL LOW (ref 3.80–5.20)
RDW: 17.1 % — ABNORMAL HIGH (ref 11.5–14.5)
WBC: 4.3 10*3/uL (ref 3.6–11.0)

## 2014-02-16 LAB — BASIC METABOLIC PANEL
Anion Gap: 6 — ABNORMAL LOW (ref 7–16)
BUN: 34 mg/dL — AB (ref 7–18)
CO2: 27 mmol/L (ref 21–32)
CREATININE: 1.79 mg/dL — AB (ref 0.60–1.30)
Calcium, Total: 8.2 mg/dL — ABNORMAL LOW (ref 8.5–10.1)
Chloride: 100 mmol/L (ref 98–107)
EGFR (African American): 32 — ABNORMAL LOW
GFR CALC NON AF AMER: 28 — AB
Glucose: 342 mg/dL — ABNORMAL HIGH (ref 65–99)
OSMOLALITY: 288 (ref 275–301)
POTASSIUM: 4.5 mmol/L (ref 3.5–5.1)
SODIUM: 133 mmol/L — AB (ref 136–145)

## 2014-02-16 LAB — CREATININE, SERUM
Creatinine: 1.71 mg/dL — ABNORMAL HIGH (ref 0.60–1.30)
EGFR (African American): 34 — ABNORMAL LOW
EGFR (Non-African Amer.): 29 — ABNORMAL LOW

## 2014-02-17 ENCOUNTER — Emergency Department: Payer: Self-pay | Admitting: Emergency Medicine

## 2014-02-17 LAB — COMPREHENSIVE METABOLIC PANEL
ALK PHOS: 145 U/L — AB
ALT: 18 U/L (ref 12–78)
Albumin: 2.3 g/dL — ABNORMAL LOW (ref 3.4–5.0)
Anion Gap: 3 — ABNORMAL LOW (ref 7–16)
BILIRUBIN TOTAL: 0.3 mg/dL (ref 0.2–1.0)
BUN: 26 mg/dL — AB (ref 7–18)
CHLORIDE: 105 mmol/L (ref 98–107)
CO2: 30 mmol/L (ref 21–32)
Calcium, Total: 8.5 mg/dL (ref 8.5–10.1)
Creatinine: 1.31 mg/dL — ABNORMAL HIGH (ref 0.60–1.30)
EGFR (Non-African Amer.): 41 — ABNORMAL LOW
GFR CALC AF AMER: 47 — AB
GLUCOSE: 195 mg/dL — AB (ref 65–99)
OSMOLALITY: 286 (ref 275–301)
Potassium: 4.5 mmol/L (ref 3.5–5.1)
SGOT(AST): 24 U/L (ref 15–37)
Sodium: 138 mmol/L (ref 136–145)
Total Protein: 5.6 g/dL — ABNORMAL LOW (ref 6.4–8.2)

## 2014-02-17 LAB — CBC
HCT: 31.7 % — ABNORMAL LOW (ref 35.0–47.0)
HGB: 10.3 g/dL — AB (ref 12.0–16.0)
MCH: 26 pg (ref 26.0–34.0)
MCHC: 32.4 g/dL (ref 32.0–36.0)
MCV: 80 fL (ref 80–100)
PLATELETS: 159 10*3/uL (ref 150–440)
RBC: 3.96 10*6/uL (ref 3.80–5.20)
RDW: 17 % — ABNORMAL HIGH (ref 11.5–14.5)
WBC: 3.8 10*3/uL (ref 3.6–11.0)

## 2014-02-17 LAB — URINALYSIS, COMPLETE
Bacteria: NONE SEEN
Bilirubin,UR: NEGATIVE
Glucose,UR: 150 mg/dL (ref 0–75)
Ketone: NEGATIVE
LEUKOCYTE ESTERASE: NEGATIVE
Nitrite: NEGATIVE
Ph: 6 (ref 4.5–8.0)
Protein: >=500
RBC,UR: 8 /HPF (ref 0–5)
Specific Gravity: 1.011 (ref 1.003–1.030)
Squamous Epithelial: <1
WBC UR: 6 /HPF (ref 0–5)

## 2014-02-17 LAB — TROPONIN I: Troponin-I: <0.02 ng/mL

## 2014-02-18 LAB — URINE CULTURE

## 2014-02-19 LAB — CULTURE, BLOOD (SINGLE)

## 2014-02-28 ENCOUNTER — Other Ambulatory Visit: Payer: Self-pay | Admitting: Family Medicine

## 2014-02-28 LAB — HEMOGLOBIN: HGB: 7.4 g/dL — AB (ref 12.0–16.0)

## 2014-03-30 ENCOUNTER — Ambulatory Visit: Payer: Self-pay | Admitting: Oncology

## 2014-03-30 LAB — IRON AND TIBC
IRON BIND. CAP.(TOTAL): 257 ug/dL (ref 250–450)
Iron Saturation: 8 %
Iron: 20 ug/dL — ABNORMAL LOW (ref 50–170)
UNBOUND IRON-BIND. CAP.: 237 ug/dL

## 2014-03-30 LAB — CBC CANCER CENTER
Basophil #: 0.1 x10 3/mm (ref 0.0–0.1)
Basophil %: 1.3 %
Eosinophil #: 0.1 x10 3/mm (ref 0.0–0.7)
Eosinophil %: 1.4 %
HCT: 28.2 % — AB (ref 35.0–47.0)
HGB: 8.9 g/dL — ABNORMAL LOW (ref 12.0–16.0)
LYMPHS ABS: 0.8 x10 3/mm — AB (ref 1.0–3.6)
Lymphocyte %: 12.8 %
MCH: 26.8 pg (ref 26.0–34.0)
MCHC: 31.4 g/dL — ABNORMAL LOW (ref 32.0–36.0)
MCV: 85 fL (ref 80–100)
MONO ABS: 0.4 x10 3/mm (ref 0.2–0.9)
MONOS PCT: 6.6 %
NEUTROS ABS: 5 x10 3/mm (ref 1.4–6.5)
Neutrophil %: 77.9 %
Platelet: 183 x10 3/mm (ref 150–440)
RBC: 3.3 10*6/uL — AB (ref 3.80–5.20)
RDW: 16.9 % — ABNORMAL HIGH (ref 11.5–14.5)
WBC: 6.4 x10 3/mm (ref 3.6–11.0)

## 2014-03-30 LAB — FERRITIN: FERRITIN (ARMC): 51 ng/mL (ref 8–388)

## 2014-03-30 LAB — FOLATE: FOLIC ACID: 10.4 ng/mL (ref 3.1–100.0)

## 2014-03-30 LAB — LACTATE DEHYDROGENASE: LDH: 210 U/L (ref 81–246)

## 2014-04-03 LAB — PROT IMMUNOELECTROPHORES(ARMC)

## 2014-04-13 LAB — CBC CANCER CENTER
BASOS ABS: 0.1 x10 3/mm (ref 0.0–0.1)
Basophil %: 1.1 %
EOS PCT: 3.8 %
Eosinophil #: 0.2 x10 3/mm (ref 0.0–0.7)
HCT: 28 % — ABNORMAL LOW (ref 35.0–47.0)
HGB: 8.9 g/dL — ABNORMAL LOW (ref 12.0–16.0)
LYMPHS PCT: 17.8 %
Lymphocyte #: 0.9 x10 3/mm — ABNORMAL LOW (ref 1.0–3.6)
MCH: 27.1 pg (ref 26.0–34.0)
MCHC: 31.9 g/dL — ABNORMAL LOW (ref 32.0–36.0)
MCV: 85 fL (ref 80–100)
MONO ABS: 0.4 x10 3/mm (ref 0.2–0.9)
Monocyte %: 7.3 %
NEUTROS PCT: 70 %
Neutrophil #: 3.7 x10 3/mm (ref 1.4–6.5)
Platelet: 139 x10 3/mm — ABNORMAL LOW (ref 150–440)
RBC: 3.3 10*6/uL — AB (ref 3.80–5.20)
RDW: 18.8 % — ABNORMAL HIGH (ref 11.5–14.5)
WBC: 5.3 x10 3/mm (ref 3.6–11.0)

## 2014-04-19 ENCOUNTER — Ambulatory Visit: Payer: Self-pay | Admitting: Oncology

## 2014-05-05 IMAGING — CR DG ANKLE PORT 2V*L*
3 series · 3 of 3 positions shown · non-contrast
Comparison: None.

CLINICAL DATA: Post removal of external fixator with placement of
splint.

EXAM:
PORTABLE LEFT ANKLE - 2 VIEW

[AP (1 of 2)]
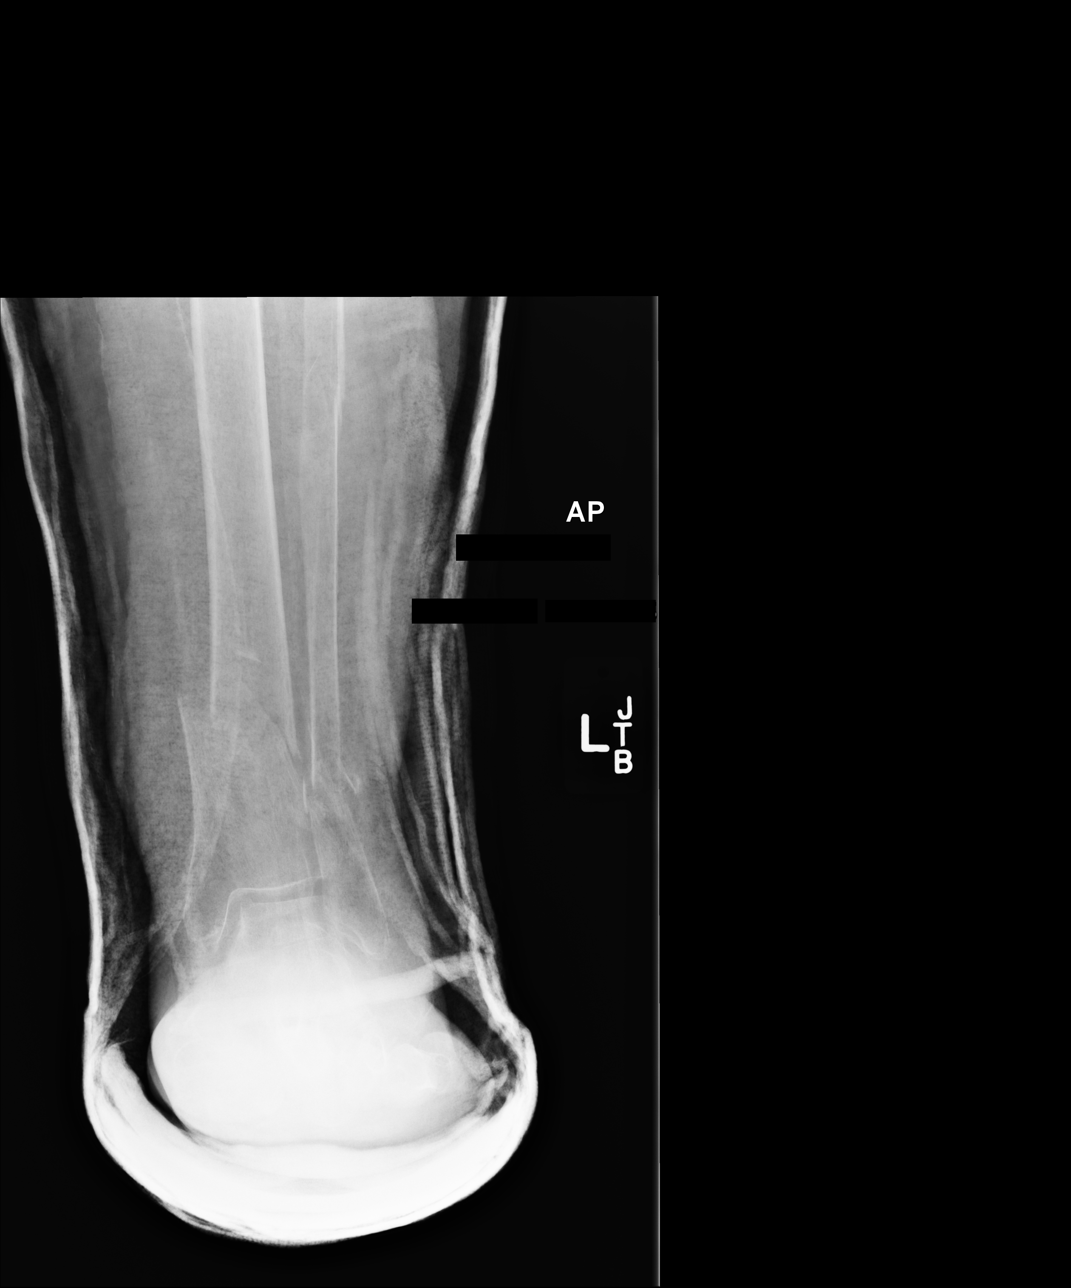

[lateral]
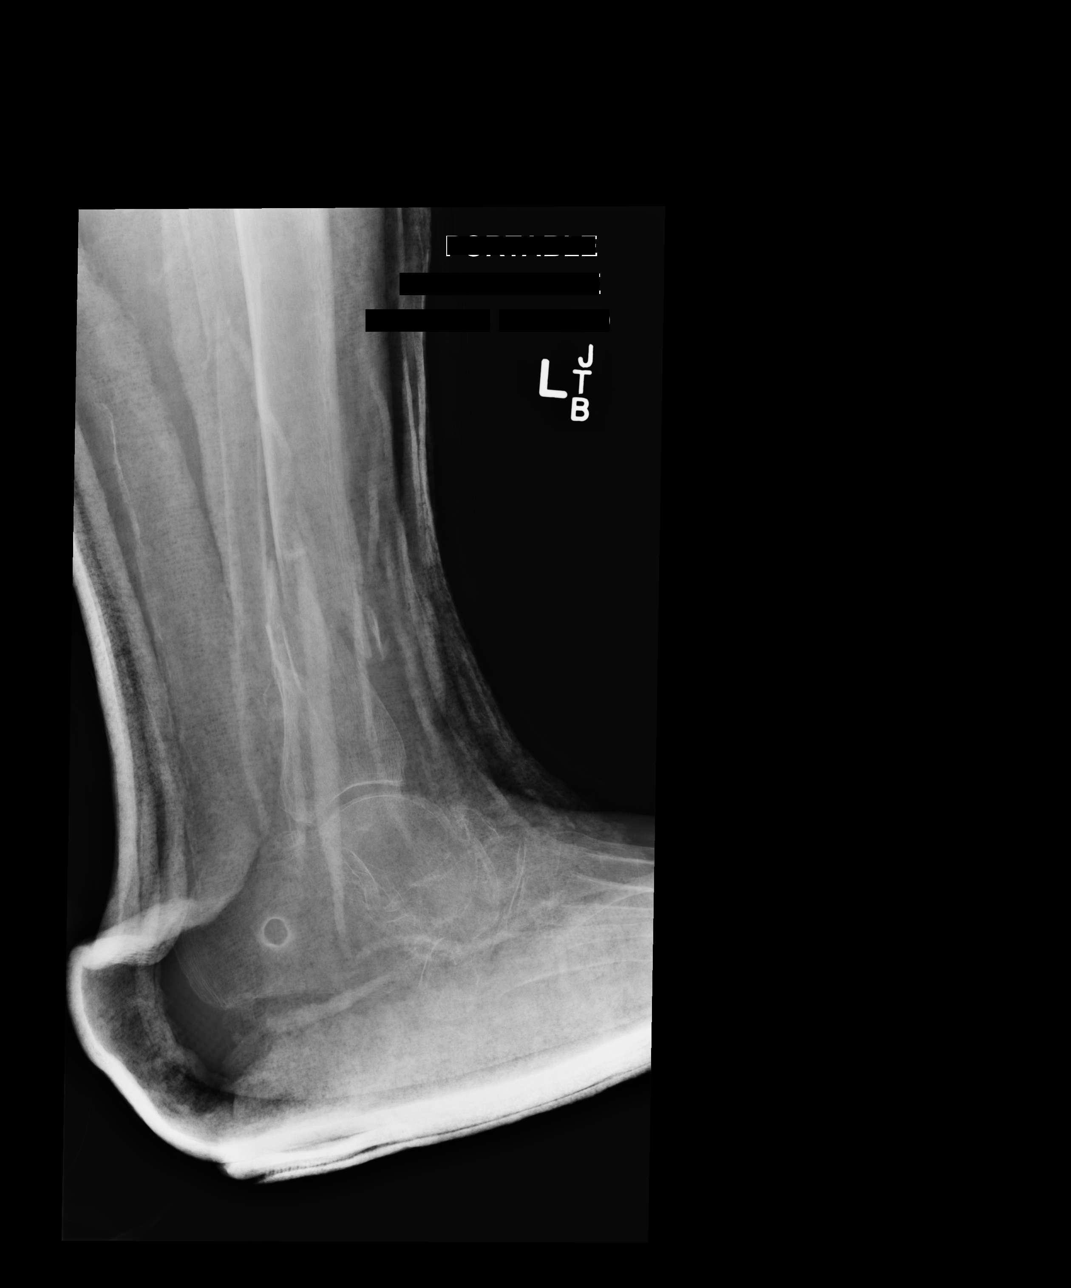

[AP (2 of 2)]
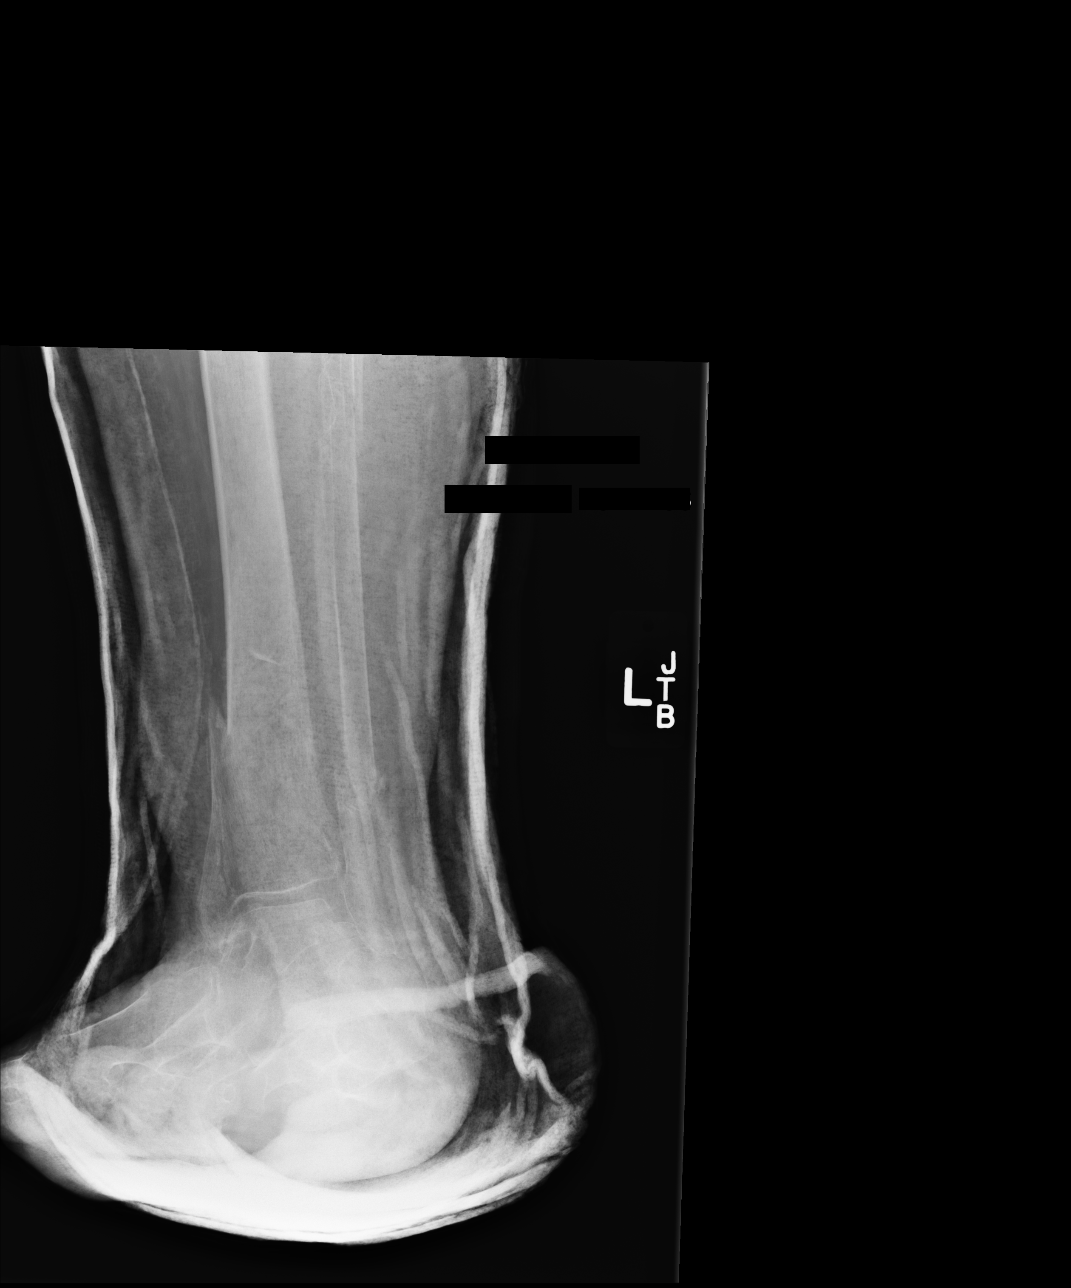

[3 of 3 positions shown; findings below may reference images not displayed]

FINDINGS: Splint obscures fine osseous and soft tissue detail.

Comminuted fracture of the distal left tibia and fibula. Separation
and angulation of fracture fragments. No comparison exams to
determine if this represents a change. Radiographically, fractures
have not healed.

Evidence of external fixation pin track within the calcaneus.

Bones are osteopenic.
IMPRESSION: Comminuted fracture of the distal left tibia and fibula. Separation
and angulation of fracture fragments. No comparison exams to
determine if this represents a change. Radiographically, fractures
have not healed.

## 2014-05-05 IMAGING — CR DG CHEST 2V
2 series · 2 of 2 positions shown · non-contrast
Comparison: None.

CLINICAL DATA: Hypertension, initial encounter.

CHEST - 2 VIEW

[w chest lat]
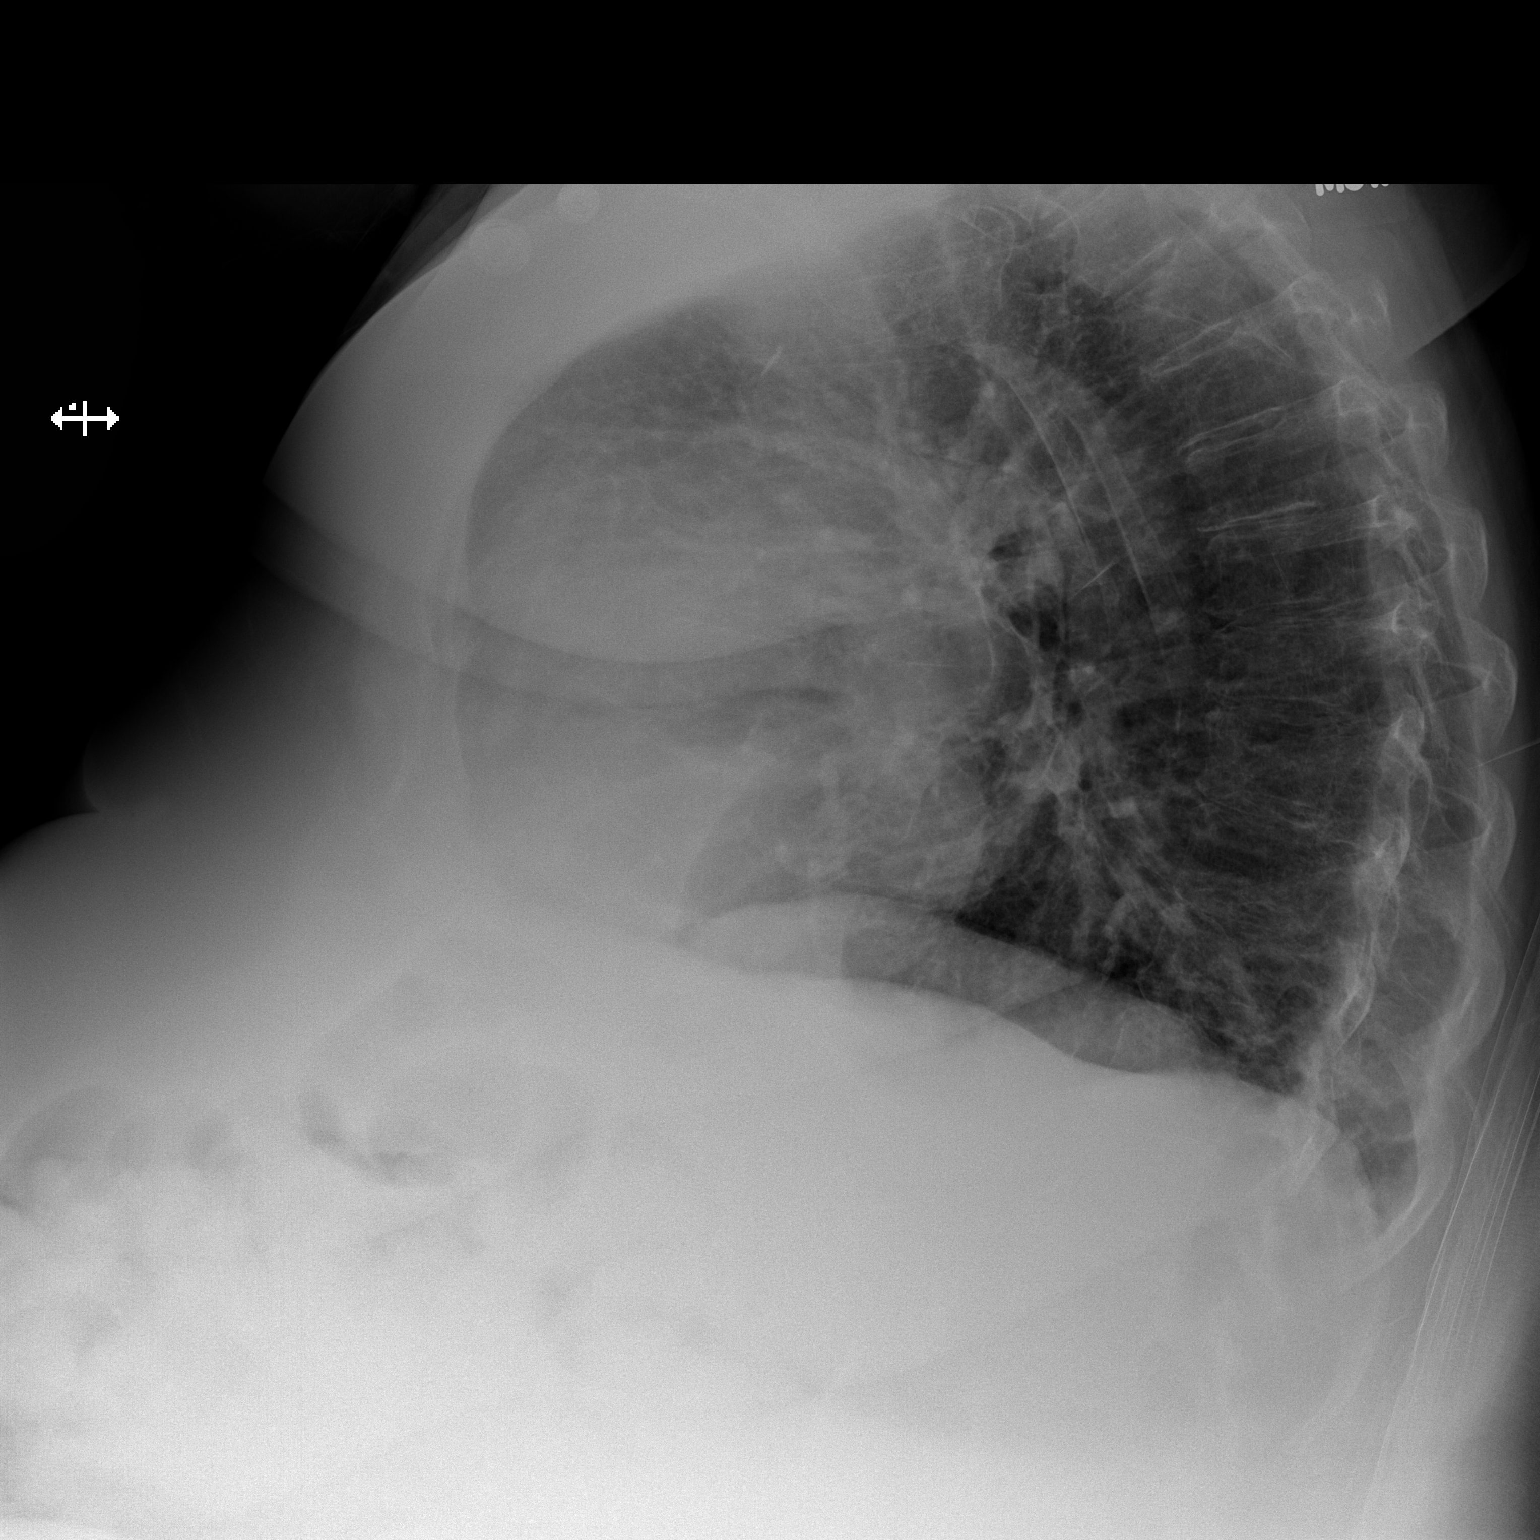

[x chest ap]
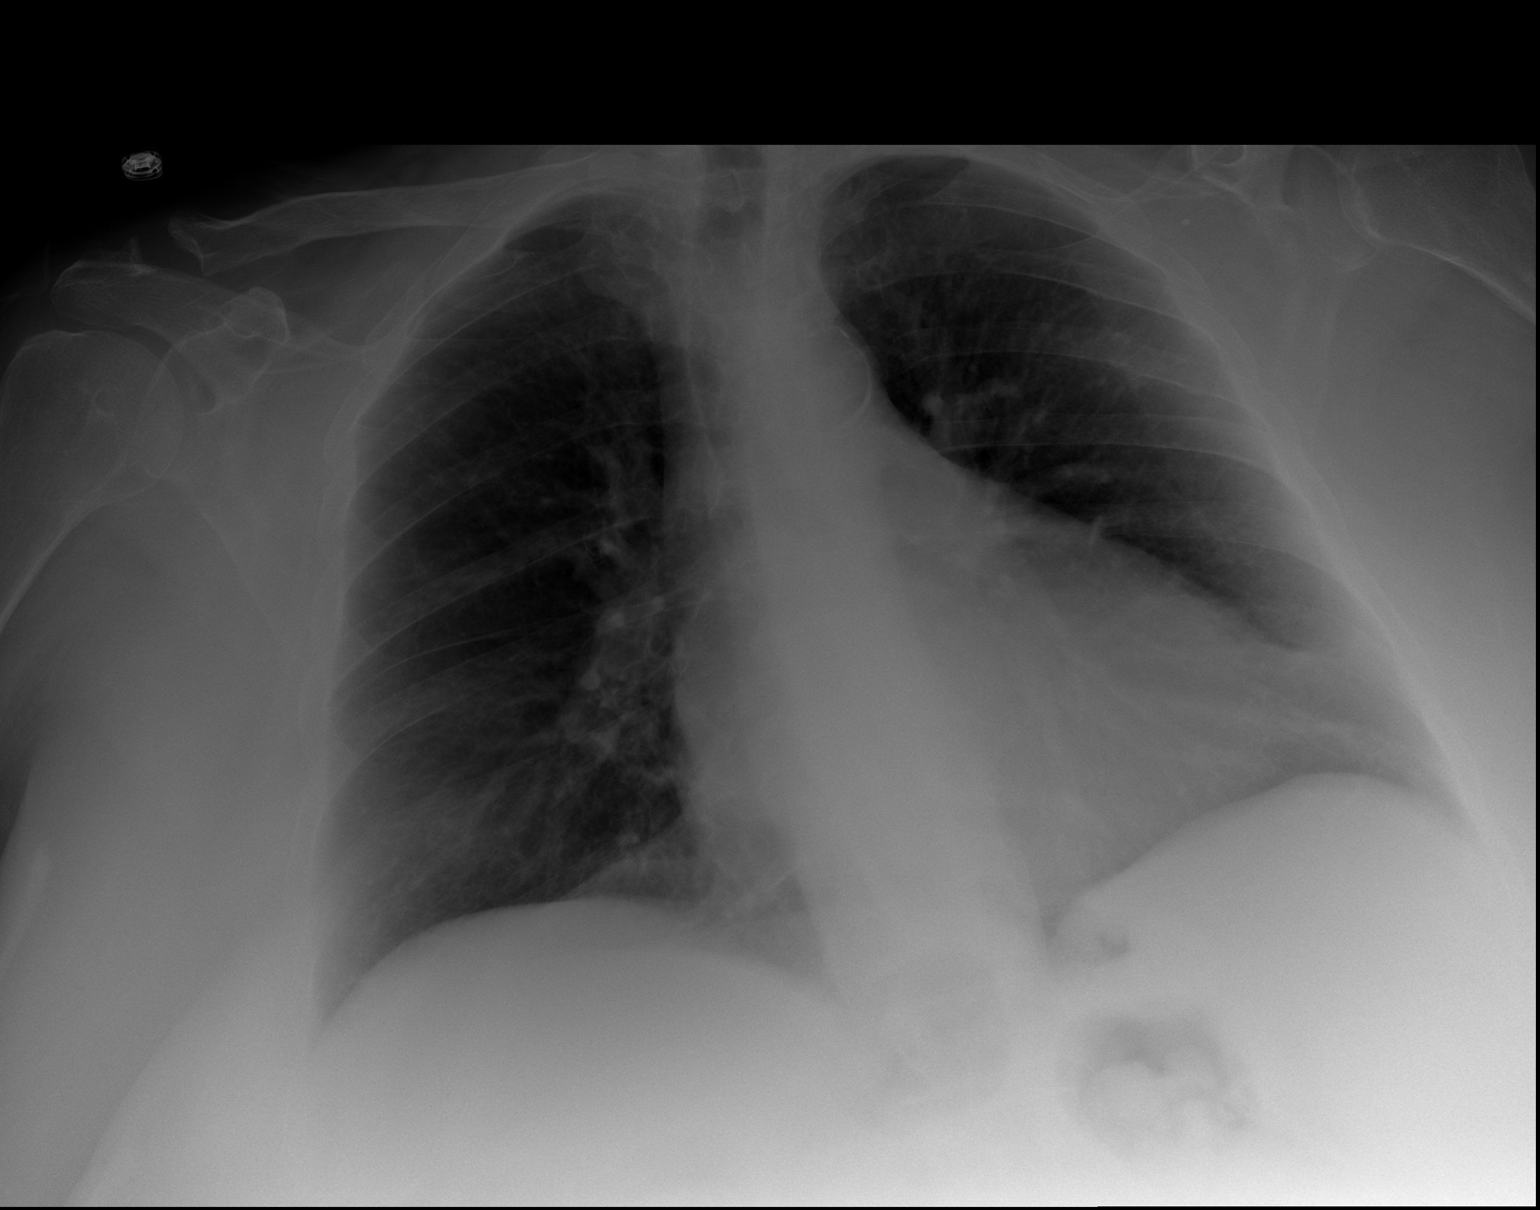

[2 of 2 positions shown; findings below may reference images not displayed]

FINDINGS: Examination is degraded due to patient body habitus.

Enlarged cardiac silhouette and mediastinal contours with
atherosclerotic plaque within the thoracic aorta.  Evaluation of
the retrosternal clear space is obscured secondary to overlying
soft tissues.  The lungs appear hyperexpanded with flattening of
the hemidiaphragms and mild diffuse thickening of the interstitium.
There is mild eventration of the left hemidiaphragm.  No focal
airspace opacity.  No pleural effusion or pneumothorax.  No
definite evidence of edema.  Osteopenia without definite acute
osseous abnormality. There is presumed chronic widening of the
right AC joint.
IMPRESSION: Cardiomegaly and bronchitic change without acute cardiopulmonary
disease.

## 2014-05-25 ENCOUNTER — Ambulatory Visit: Payer: Self-pay | Admitting: Oncology

## 2014-05-25 LAB — CBC CANCER CENTER
BASOS ABS: 0.1 x10 3/mm (ref 0.0–0.1)
Basophil %: 1.3 %
Eosinophil #: 0.3 x10 3/mm (ref 0.0–0.7)
Eosinophil %: 6.3 %
HCT: 31.9 % — ABNORMAL LOW (ref 35.0–47.0)
HGB: 10.4 g/dL — AB (ref 12.0–16.0)
Lymphocyte #: 1.1 x10 3/mm (ref 1.0–3.6)
Lymphocyte %: 21.8 %
MCH: 28 pg (ref 26.0–34.0)
MCHC: 32.7 g/dL (ref 32.0–36.0)
MCV: 86 fL (ref 80–100)
MONO ABS: 0.3 x10 3/mm (ref 0.2–0.9)
Monocyte %: 5.9 %
NEUTROS ABS: 3.4 x10 3/mm (ref 1.4–6.5)
Neutrophil %: 64.7 %
Platelet: 154 x10 3/mm (ref 150–440)
RBC: 3.73 10*6/uL — ABNORMAL LOW (ref 3.80–5.20)
RDW: 16.7 % — AB (ref 11.5–14.5)
WBC: 5.2 x10 3/mm (ref 3.6–11.0)

## 2014-05-25 LAB — FERRITIN: Ferritin (ARMC): 141 ng/mL (ref 8–388)

## 2014-05-25 LAB — IRON AND TIBC
Iron Bind.Cap.(Total): 192 ug/dL — ABNORMAL LOW (ref 250–450)
Iron Saturation: 29 %
Iron: 56 ug/dL (ref 50–170)
UNBOUND IRON-BIND. CAP.: 136 ug/dL

## 2014-06-20 ENCOUNTER — Ambulatory Visit: Payer: Self-pay | Admitting: Oncology

## 2014-08-25 ENCOUNTER — Ambulatory Visit: Payer: Self-pay | Admitting: Oncology

## 2014-08-25 LAB — CBC CANCER CENTER
Basophil #: 0.1 x10 3/mm (ref 0.0–0.1)
Basophil %: 1.2 %
EOS ABS: 0.2 x10 3/mm (ref 0.0–0.7)
EOS PCT: 4.5 %
HCT: 27.3 % — ABNORMAL LOW (ref 35.0–47.0)
HGB: 9 g/dL — ABNORMAL LOW (ref 12.0–16.0)
LYMPHS PCT: 22.9 %
Lymphocyte #: 1.1 x10 3/mm (ref 1.0–3.6)
MCH: 29 pg (ref 26.0–34.0)
MCHC: 33.1 g/dL (ref 32.0–36.0)
MCV: 88 fL (ref 80–100)
MONOS PCT: 8.5 %
Monocyte #: 0.4 x10 3/mm (ref 0.2–0.9)
NEUTROS PCT: 62.9 %
Neutrophil #: 3.1 x10 3/mm (ref 1.4–6.5)
PLATELETS: 143 x10 3/mm — AB (ref 150–440)
RBC: 3.1 10*6/uL — AB (ref 3.80–5.20)
RDW: 16 % — ABNORMAL HIGH (ref 11.5–14.5)
WBC: 4.9 x10 3/mm (ref 3.6–11.0)

## 2014-08-25 LAB — IRON AND TIBC
IRON BIND. CAP.(TOTAL): 183 ug/dL — AB (ref 250–450)
IRON: 35 ug/dL — AB (ref 50–170)
Iron Saturation: 19 %
UNBOUND IRON-BIND. CAP.: 148 ug/dL

## 2014-08-25 LAB — FERRITIN: FERRITIN (ARMC): 115 ng/mL (ref 8–388)

## 2014-08-31 ENCOUNTER — Ambulatory Visit: Payer: Self-pay | Admitting: Vascular Surgery

## 2014-08-31 LAB — BASIC METABOLIC PANEL
ANION GAP: 6 — AB (ref 7–16)
BUN: 50 mg/dL — AB (ref 7–18)
CALCIUM: 8.2 mg/dL — AB (ref 8.5–10.1)
Chloride: 106 mmol/L (ref 98–107)
Co2: 32 mmol/L (ref 21–32)
Creatinine: 2.43 mg/dL — ABNORMAL HIGH (ref 0.60–1.30)
EGFR (African American): 25 — ABNORMAL LOW
EGFR (Non-African Amer.): 21 — ABNORMAL LOW
GLUCOSE: 71 mg/dL (ref 65–99)
Osmolality: 299 (ref 275–301)
Potassium: 4.4 mmol/L (ref 3.5–5.1)
Sodium: 144 mmol/L (ref 136–145)

## 2014-08-31 LAB — PROTIME-INR
INR: 1
Prothrombin Time: 13.4 secs (ref 11.5–14.7)

## 2014-08-31 LAB — APTT: Activated PTT: 28.4 secs (ref 23.6–35.9)

## 2014-09-13 ENCOUNTER — Inpatient Hospital Stay: Payer: Self-pay | Admitting: Internal Medicine

## 2014-09-13 LAB — CREATININE, SERUM
CREATININE: 2.4 mg/dL — AB (ref 0.60–1.30)
EGFR (African American): 25 — ABNORMAL LOW
EGFR (Non-African Amer.): 21 — ABNORMAL LOW

## 2014-09-14 LAB — URINALYSIS, COMPLETE
BLOOD: NEGATIVE
Bilirubin,UR: NEGATIVE
GLUCOSE, UR: NEGATIVE mg/dL (ref 0–75)
Ketone: NEGATIVE
NITRITE: NEGATIVE
Ph: 5 (ref 4.5–8.0)
SPECIFIC GRAVITY: 1.013 (ref 1.003–1.030)
WBC UR: 951 /HPF (ref 0–5)

## 2014-09-14 LAB — CBC WITH DIFFERENTIAL/PLATELET
BASOS ABS: 0 10*3/uL (ref 0.0–0.1)
BASOS PCT: 0.5 %
Eosinophil #: 0.1 10*3/uL (ref 0.0–0.7)
Eosinophil %: 1.4 %
HCT: 25.2 % — ABNORMAL LOW (ref 35.0–47.0)
HGB: 8.3 g/dL — ABNORMAL LOW (ref 12.0–16.0)
LYMPHS ABS: 0.6 10*3/uL — AB (ref 1.0–3.6)
Lymphocyte %: 8 %
MCH: 30 pg (ref 26.0–34.0)
MCHC: 33 g/dL (ref 32.0–36.0)
MCV: 91 fL (ref 80–100)
Monocyte #: 0.2 x10 3/mm (ref 0.2–0.9)
Monocyte %: 3.4 %
NEUTROS ABS: 6.2 10*3/uL (ref 1.4–6.5)
NEUTROS PCT: 86.7 %
PLATELETS: 133 10*3/uL — AB (ref 150–440)
RBC: 2.77 10*6/uL — AB (ref 3.80–5.20)
RDW: 17.4 % — AB (ref 11.5–14.5)
WBC: 7.2 10*3/uL (ref 3.6–11.0)

## 2014-09-14 LAB — BASIC METABOLIC PANEL
Anion Gap: 5 — ABNORMAL LOW (ref 7–16)
BUN: 54 mg/dL — AB (ref 7–18)
Calcium, Total: 7.5 mg/dL — ABNORMAL LOW (ref 8.5–10.1)
Chloride: 107 mmol/L (ref 98–107)
Co2: 31 mmol/L (ref 21–32)
Creatinine: 2.85 mg/dL — ABNORMAL HIGH (ref 0.60–1.30)
GFR CALC AF AMER: 21 — AB
GFR CALC NON AF AMER: 17 — AB
Glucose: 34 mg/dL — CL (ref 65–99)
OSMOLALITY: 296 (ref 275–301)
POTASSIUM: 4.4 mmol/L (ref 3.5–5.1)
Sodium: 143 mmol/L (ref 136–145)

## 2014-09-15 LAB — CREATININE, SERUM
Creatinine: 2.83 mg/dL — ABNORMAL HIGH (ref 0.60–1.30)
EGFR (African American): 21 — ABNORMAL LOW
GFR CALC NON AF AMER: 17 — AB

## 2014-09-15 LAB — CREATININE CLEARANCE, URINE, 24 HOUR
COLLECTION HOURS: 24 h
CREATININE CLEARANCE: 16 mL/min — AB (ref 80–130)
Creatinine, Serum: 2.83 mg/dL — ABNORMAL HIGH (ref 0.60–1.30)
Creatinine, Urine: 61.2 mg/dL (ref 30.0–125.0)
TOTAL VOLUME C4TV(ML): 1450 mL

## 2014-09-16 LAB — BASIC METABOLIC PANEL
ANION GAP: 6 — AB (ref 7–16)
BUN: 55 mg/dL — AB (ref 7–18)
CALCIUM: 8.3 mg/dL — AB (ref 8.5–10.1)
CHLORIDE: 99 mmol/L (ref 98–107)
CREATININE: 2.51 mg/dL — AB (ref 0.60–1.30)
Co2: 30 mmol/L (ref 21–32)
EGFR (Non-African Amer.): 20 — ABNORMAL LOW
GFR CALC AF AMER: 24 — AB
Glucose: 230 mg/dL — ABNORMAL HIGH (ref 65–99)
Osmolality: 293 (ref 275–301)
Potassium: 4.4 mmol/L (ref 3.5–5.1)
Sodium: 135 mmol/L — ABNORMAL LOW (ref 136–145)

## 2014-09-17 LAB — BASIC METABOLIC PANEL
Anion Gap: 7 (ref 7–16)
BUN: 61 mg/dL — ABNORMAL HIGH (ref 7–18)
CALCIUM: 8.4 mg/dL — AB (ref 8.5–10.1)
CO2: 31 mmol/L (ref 21–32)
Chloride: 99 mmol/L (ref 98–107)
Creatinine: 2.37 mg/dL — ABNORMAL HIGH (ref 0.60–1.30)
EGFR (African American): 26 — ABNORMAL LOW
EGFR (Non-African Amer.): 21 — ABNORMAL LOW
Glucose: 279 mg/dL — ABNORMAL HIGH (ref 65–99)
OSMOLALITY: 301 (ref 275–301)
POTASSIUM: 4.4 mmol/L (ref 3.5–5.1)
SODIUM: 137 mmol/L (ref 136–145)

## 2014-09-17 LAB — URINE CULTURE

## 2014-09-18 LAB — BASIC METABOLIC PANEL
Anion Gap: 5 — ABNORMAL LOW (ref 7–16)
BUN: 57 mg/dL — ABNORMAL HIGH (ref 7–18)
CHLORIDE: 99 mmol/L (ref 98–107)
CO2: 34 mmol/L — AB (ref 21–32)
CREATININE: 2.41 mg/dL — AB (ref 0.60–1.30)
Calcium, Total: 8.4 mg/dL — ABNORMAL LOW (ref 8.5–10.1)
EGFR (African American): 25 — ABNORMAL LOW
GFR CALC NON AF AMER: 21 — AB
Glucose: 281 mg/dL — ABNORMAL HIGH (ref 65–99)
OSMOLALITY: 302 (ref 275–301)
Potassium: 4.3 mmol/L (ref 3.5–5.1)
Sodium: 138 mmol/L (ref 136–145)

## 2014-09-18 LAB — PLATELET COUNT: Platelet: 108 10*3/uL — ABNORMAL LOW (ref 150–440)

## 2014-09-19 ENCOUNTER — Ambulatory Visit: Payer: Self-pay | Admitting: Oncology

## 2014-10-04 ENCOUNTER — Inpatient Hospital Stay: Payer: Self-pay | Admitting: Internal Medicine

## 2014-10-04 LAB — CBC WITH DIFFERENTIAL/PLATELET
BASOS PCT: 1.2 %
Basophil #: 0.1 10*3/uL (ref 0.0–0.1)
Eosinophil #: 0.1 10*3/uL (ref 0.0–0.7)
Eosinophil %: 2.6 %
HCT: 28.4 % — AB (ref 35.0–47.0)
HGB: 9.3 g/dL — AB (ref 12.0–16.0)
LYMPHS PCT: 18.2 %
Lymphocyte #: 1 10*3/uL (ref 1.0–3.6)
MCH: 28.9 pg (ref 26.0–34.0)
MCHC: 32.8 g/dL (ref 32.0–36.0)
MCV: 88 fL (ref 80–100)
MONO ABS: 0.4 x10 3/mm (ref 0.2–0.9)
Monocyte %: 7.7 %
NEUTROS ABS: 3.8 10*3/uL (ref 1.4–6.5)
Neutrophil %: 70.3 %
Platelet: 186 10*3/uL (ref 150–440)
RBC: 3.22 10*6/uL — ABNORMAL LOW (ref 3.80–5.20)
RDW: 15.1 % — ABNORMAL HIGH (ref 11.5–14.5)
WBC: 5.4 10*3/uL (ref 3.6–11.0)

## 2014-10-04 LAB — COMPREHENSIVE METABOLIC PANEL
ALK PHOS: 133 U/L — AB
Albumin: 2.3 g/dL — ABNORMAL LOW (ref 3.4–5.0)
Anion Gap: 1 — ABNORMAL LOW (ref 7–16)
BUN: 43 mg/dL — AB (ref 7–18)
Bilirubin,Total: 0.3 mg/dL (ref 0.2–1.0)
CALCIUM: 8.6 mg/dL (ref 8.5–10.1)
CHLORIDE: 95 mmol/L — AB (ref 98–107)
Co2: 38 mmol/L — ABNORMAL HIGH (ref 21–32)
Creatinine: 2.11 mg/dL — ABNORMAL HIGH (ref 0.60–1.30)
GFR CALC AF AMER: 30 — AB
GFR CALC NON AF AMER: 24 — AB
Glucose: 172 mg/dL — ABNORMAL HIGH (ref 65–99)
OSMOLALITY: 283 (ref 275–301)
Potassium: 5.7 mmol/L — ABNORMAL HIGH (ref 3.5–5.1)
SGOT(AST): 33 U/L (ref 15–37)
SGPT (ALT): 19 U/L
SODIUM: 134 mmol/L — AB (ref 136–145)
Total Protein: 7.2 g/dL (ref 6.4–8.2)

## 2014-10-04 LAB — URINALYSIS, COMPLETE
BACTERIA: NONE SEEN
Bilirubin,UR: NEGATIVE
Blood: NEGATIVE
Ketone: NEGATIVE
LEUKOCYTE ESTERASE: NEGATIVE
Nitrite: NEGATIVE
Ph: 8 (ref 4.5–8.0)
RBC,UR: 7 /HPF (ref 0–5)
Specific Gravity: 1.009 (ref 1.003–1.030)
Squamous Epithelial: NONE SEEN
WBC UR: 2 /HPF (ref 0–5)

## 2014-10-04 LAB — INFLUENZA A,B,H1N1 - PCR (ARMC)
H1N1FLUPCR: NOT DETECTED
INFLAPCR: NEGATIVE
Influenza B By PCR: NEGATIVE

## 2014-10-04 LAB — PROTIME-INR
INR: 1
PROTHROMBIN TIME: 13 s (ref 11.5–14.7)

## 2014-10-04 LAB — PHOSPHORUS: PHOSPHORUS: 4.2 mg/dL (ref 2.5–4.9)

## 2014-10-04 LAB — TROPONIN I: Troponin-I: <0.02 ng/mL

## 2014-10-04 LAB — MAGNESIUM: MAGNESIUM: 2 mg/dL

## 2014-10-05 LAB — CBC WITH DIFFERENTIAL/PLATELET
BASOS ABS: 0 10*3/uL (ref 0.0–0.1)
Basophil %: 1.2 %
EOS PCT: 3.5 %
Eosinophil #: 0.1 10*3/uL (ref 0.0–0.7)
HCT: 22.8 % — AB (ref 35.0–47.0)
HGB: 7.5 g/dL — AB (ref 12.0–16.0)
Lymphocyte #: 0.6 10*3/uL — ABNORMAL LOW (ref 1.0–3.6)
Lymphocyte %: 15.5 %
MCH: 28.9 pg (ref 26.0–34.0)
MCHC: 33 g/dL (ref 32.0–36.0)
MCV: 88 fL (ref 80–100)
MONO ABS: 0.3 x10 3/mm (ref 0.2–0.9)
Monocyte %: 8.1 %
NEUTROS ABS: 2.7 10*3/uL (ref 1.4–6.5)
Neutrophil %: 71.7 %
Platelet: 146 10*3/uL — ABNORMAL LOW (ref 150–440)
RBC: 2.6 10*6/uL — ABNORMAL LOW (ref 3.80–5.20)
RDW: 14.9 % — AB (ref 11.5–14.5)
WBC: 3.8 10*3/uL (ref 3.6–11.0)

## 2014-10-05 LAB — BASIC METABOLIC PANEL
Anion Gap: 5 — ABNORMAL LOW (ref 7–16)
BUN: 44 mg/dL — ABNORMAL HIGH (ref 7–18)
Calcium, Total: 8 mg/dL — ABNORMAL LOW (ref 8.5–10.1)
Chloride: 97 mmol/L — ABNORMAL LOW (ref 98–107)
Co2: 35 mmol/L — ABNORMAL HIGH (ref 21–32)
Creatinine: 2.18 mg/dL — ABNORMAL HIGH (ref 0.60–1.30)
EGFR (African American): 28 — ABNORMAL LOW
GFR CALC NON AF AMER: 23 — AB
Glucose: 146 mg/dL — ABNORMAL HIGH (ref 65–99)
Osmolality: 288 (ref 275–301)
Potassium: 4.2 mmol/L (ref 3.5–5.1)
SODIUM: 137 mmol/L (ref 136–145)

## 2014-10-05 LAB — TSH: THYROID STIMULATING HORM: 3.8 u[IU]/mL

## 2014-10-06 LAB — BASIC METABOLIC PANEL
ANION GAP: 7 (ref 7–16)
BUN: 42 mg/dL — ABNORMAL HIGH (ref 7–18)
CALCIUM: 8 mg/dL — AB (ref 8.5–10.1)
CHLORIDE: 97 mmol/L — AB (ref 98–107)
CREATININE: 2.37 mg/dL — AB (ref 0.60–1.30)
Co2: 35 mmol/L — ABNORMAL HIGH (ref 21–32)
EGFR (African American): 26 — ABNORMAL LOW
EGFR (Non-African Amer.): 21 — ABNORMAL LOW
Glucose: 259 mg/dL — ABNORMAL HIGH (ref 65–99)
Osmolality: 297 (ref 275–301)
Potassium: 4.2 mmol/L (ref 3.5–5.1)
Sodium: 139 mmol/L (ref 136–145)

## 2014-10-06 LAB — CBC WITH DIFFERENTIAL/PLATELET
BASOS ABS: 0 10*3/uL (ref 0.0–0.1)
Basophil %: 0.9 %
Eosinophil #: 0.2 10*3/uL (ref 0.0–0.7)
Eosinophil %: 5 %
HCT: 22.7 % — AB (ref 35.0–47.0)
HGB: 7.4 g/dL — ABNORMAL LOW (ref 12.0–16.0)
Lymphocyte #: 0.8 10*3/uL — ABNORMAL LOW (ref 1.0–3.6)
Lymphocyte %: 20.4 %
MCH: 28.6 pg (ref 26.0–34.0)
MCHC: 32.5 g/dL (ref 32.0–36.0)
MCV: 88 fL (ref 80–100)
MONOS PCT: 7.5 %
Monocyte #: 0.3 x10 3/mm (ref 0.2–0.9)
Neutrophil #: 2.6 10*3/uL (ref 1.4–6.5)
Neutrophil %: 66.2 %
PLATELETS: 150 10*3/uL (ref 150–440)
RBC: 2.58 10*6/uL — ABNORMAL LOW (ref 3.80–5.20)
RDW: 15 % — ABNORMAL HIGH (ref 11.5–14.5)
WBC: 4 10*3/uL (ref 3.6–11.0)

## 2014-10-06 LAB — CREATININE CLEARANCE, URINE, 24 HOUR
COLLECTION HOURS: 24 h
CREATININE: 2.37 mg/dL — AB (ref 0.60–1.30)
Creatinine Clearance: 21 mL/min — ABNORMAL LOW (ref 80–130)
Creatinine, Urine: 47.1 mg/dL (ref 30.0–125.0)
Total Volume: 2000 mL

## 2014-10-08 LAB — CBC WITH DIFFERENTIAL/PLATELET
Basophil #: 0.1 10*3/uL (ref 0.0–0.1)
Basophil %: 1.3 %
EOS PCT: 5.8 %
Eosinophil #: 0.2 10*3/uL (ref 0.0–0.7)
HCT: 23.3 % — ABNORMAL LOW (ref 35.0–47.0)
HGB: 7.6 g/dL — AB (ref 12.0–16.0)
LYMPHS PCT: 16.5 %
Lymphocyte #: 0.7 10*3/uL — ABNORMAL LOW (ref 1.0–3.6)
MCH: 28.6 pg (ref 26.0–34.0)
MCHC: 32.5 g/dL (ref 32.0–36.0)
MCV: 88 fL (ref 80–100)
Monocyte #: 0.3 x10 3/mm (ref 0.2–0.9)
Monocyte %: 7.9 %
NEUTROS ABS: 2.9 10*3/uL (ref 1.4–6.5)
Neutrophil %: 68.5 %
Platelet: 138 10*3/uL — ABNORMAL LOW (ref 150–440)
RBC: 2.64 10*6/uL — ABNORMAL LOW (ref 3.80–5.20)
RDW: 14.7 % — ABNORMAL HIGH (ref 11.5–14.5)
WBC: 4.2 10*3/uL (ref 3.6–11.0)

## 2014-10-08 LAB — BASIC METABOLIC PANEL
ANION GAP: 6 — AB (ref 7–16)
BUN: 45 mg/dL — ABNORMAL HIGH (ref 7–18)
CO2: 35 mmol/L — AB (ref 21–32)
Calcium, Total: 8.1 mg/dL — ABNORMAL LOW (ref 8.5–10.1)
Chloride: 98 mmol/L (ref 98–107)
Creatinine: 2.51 mg/dL — ABNORMAL HIGH (ref 0.60–1.30)
EGFR (African American): 24 — ABNORMAL LOW
EGFR (Non-African Amer.): 20 — ABNORMAL LOW
GLUCOSE: 230 mg/dL — AB (ref 65–99)
Osmolality: 296 (ref 275–301)
Potassium: 4.1 mmol/L (ref 3.5–5.1)
SODIUM: 139 mmol/L (ref 136–145)

## 2014-10-09 LAB — CULTURE, BLOOD (SINGLE)

## 2014-10-20 ENCOUNTER — Ambulatory Visit: Payer: Self-pay | Admitting: Oncology

## 2014-10-26 ENCOUNTER — Inpatient Hospital Stay: Payer: Self-pay | Admitting: Internal Medicine

## 2014-10-26 LAB — CBC WITH DIFFERENTIAL/PLATELET
BASOS ABS: 0.1 10*3/uL (ref 0.0–0.1)
BASOS PCT: 0.9 %
Eosinophil #: 0.2 10*3/uL (ref 0.0–0.7)
Eosinophil %: 3.4 %
HCT: 27.6 % — ABNORMAL LOW (ref 35.0–47.0)
HGB: 8.8 g/dL — AB (ref 12.0–16.0)
LYMPHS PCT: 19.5 %
Lymphocyte #: 1.2 10*3/uL (ref 1.0–3.6)
MCH: 28.2 pg (ref 26.0–34.0)
MCHC: 31.7 g/dL — ABNORMAL LOW (ref 32.0–36.0)
MCV: 89 fL (ref 80–100)
MONOS PCT: 8 %
Monocyte #: 0.5 x10 3/mm (ref 0.2–0.9)
NEUTROS PCT: 68.2 %
Neutrophil #: 4.2 10*3/uL (ref 1.4–6.5)
Platelet: 157 10*3/uL (ref 150–440)
RBC: 3.1 10*6/uL — AB (ref 3.80–5.20)
RDW: 15.3 % — ABNORMAL HIGH (ref 11.5–14.5)
WBC: 6.1 10*3/uL (ref 3.6–11.0)

## 2014-10-26 LAB — BASIC METABOLIC PANEL
ANION GAP: 5 — AB (ref 7–16)
BUN: 44 mg/dL — AB (ref 7–18)
CREATININE: 2.4 mg/dL — AB (ref 0.60–1.30)
Calcium, Total: 8.9 mg/dL (ref 8.5–10.1)
Chloride: 97 mmol/L — ABNORMAL LOW (ref 98–107)
Co2: 33 mmol/L — ABNORMAL HIGH (ref 21–32)
EGFR (African American): 25 — ABNORMAL LOW
EGFR (Non-African Amer.): 21 — ABNORMAL LOW
Glucose: 242 mg/dL — ABNORMAL HIGH (ref 65–99)
Osmolality: 289 (ref 275–301)
POTASSIUM: 5.4 mmol/L — AB (ref 3.5–5.1)
Sodium: 135 mmol/L — ABNORMAL LOW (ref 136–145)

## 2014-10-26 LAB — URINALYSIS, COMPLETE
BACTERIA: NONE SEEN
BLOOD: NEGATIVE
Bilirubin,UR: NEGATIVE
Glucose,UR: >=500 mg/dL (ref 0–75)
Ketone: NEGATIVE
Nitrite: NEGATIVE
Ph: 5 (ref 4.5–8.0)
Protein: >=500
RBC,UR: 7 /HPF (ref 0–5)
Specific Gravity: 1.013 (ref 1.003–1.030)
Squamous Epithelial: NONE SEEN
WBC UR: 104 /HPF (ref 0–5)

## 2014-10-26 LAB — TROPONIN I

## 2014-10-26 LAB — PRO B NATRIURETIC PEPTIDE: B-Type Natriuretic Peptide: 9945 pg/mL — ABNORMAL HIGH (ref 0–125)

## 2014-10-27 LAB — BODY FLUID CELL COUNT WITH DIFFERENTIAL
Basophil: 0 %
Eosinophil: 0 %
Lymphocytes: 15 %
NUCLEATED CELL COUNT: 193 /mm3
Neutrophils: 42 %
OTHER MONONUCLEAR CELLS: 43 %
Other Cells BF: 0 %

## 2014-10-27 LAB — IRON AND TIBC
IRON SATURATION: 37 %
Iron Bind.Cap.(Total): 129 ug/dL — ABNORMAL LOW (ref 250–450)
Iron: 48 ug/dL — ABNORMAL LOW (ref 50–170)
Unbound Iron-Bind.Cap.: 81 ug/dL

## 2014-10-27 LAB — COMPREHENSIVE METABOLIC PANEL
ALT: 9 U/L — AB
ANION GAP: 5 — AB (ref 7–16)
Albumin: 1.7 g/dL — ABNORMAL LOW (ref 3.4–5.0)
Alkaline Phosphatase: 87 U/L
BILIRUBIN TOTAL: 0.1 mg/dL — AB (ref 0.2–1.0)
BUN: 44 mg/dL — ABNORMAL HIGH (ref 7–18)
CHLORIDE: 100 mmol/L (ref 98–107)
CO2: 34 mmol/L — AB (ref 21–32)
Calcium, Total: 8 mg/dL — ABNORMAL LOW (ref 8.5–10.1)
Creatinine: 2.18 mg/dL — ABNORMAL HIGH (ref 0.60–1.30)
GFR CALC AF AMER: 28 — AB
GFR CALC NON AF AMER: 23 — AB
Glucose: 183 mg/dL — ABNORMAL HIGH (ref 65–99)
OSMOLALITY: 293 (ref 275–301)
POTASSIUM: 4.5 mmol/L (ref 3.5–5.1)
SGOT(AST): 15 U/L (ref 15–37)
Sodium: 139 mmol/L (ref 136–145)
TOTAL PROTEIN: 5.5 g/dL — AB (ref 6.4–8.2)

## 2014-10-27 LAB — CBC WITH DIFFERENTIAL/PLATELET
BASOS PCT: 1 %
Basophil #: 0 10*3/uL (ref 0.0–0.1)
EOS PCT: 4.3 %
Eosinophil #: 0.2 10*3/uL (ref 0.0–0.7)
HCT: 20.6 % — ABNORMAL LOW (ref 35.0–47.0)
HGB: 6.9 g/dL — AB (ref 12.0–16.0)
LYMPHS PCT: 22.8 %
Lymphocyte #: 1 10*3/uL (ref 1.0–3.6)
MCH: 28.6 pg (ref 26.0–34.0)
MCHC: 33.3 g/dL (ref 32.0–36.0)
MCV: 86 fL (ref 80–100)
Monocyte #: 0.3 x10 3/mm (ref 0.2–0.9)
Monocyte %: 7.8 %
Neutrophil #: 2.7 10*3/uL (ref 1.4–6.5)
Neutrophil %: 64.1 %
Platelet: 140 10*3/uL — ABNORMAL LOW (ref 150–440)
RBC: 2.4 10*6/uL — ABNORMAL LOW (ref 3.80–5.20)
RDW: 15 % — AB (ref 11.5–14.5)
WBC: 4.2 10*3/uL (ref 3.6–11.0)

## 2014-10-27 LAB — URINALYSIS, COMPLETE
Bilirubin,UR: NEGATIVE
Glucose,UR: >=500 mg/dL (ref 0–75)
Ketone: NEGATIVE
Nitrite: NEGATIVE
Ph: 7 (ref 4.5–8.0)
Protein: 100
RBC,UR: 19 /HPF (ref 0–5)
SPECIFIC GRAVITY: 1.007 (ref 1.003–1.030)
Squamous Epithelial: 1
WBC UR: 43 /HPF (ref 0–5)

## 2014-10-27 LAB — PHOSPHORUS: PHOSPHORUS: 4 mg/dL (ref 2.5–4.9)

## 2014-10-27 LAB — MAGNESIUM: Magnesium: 1.9 mg/dL

## 2014-10-27 LAB — FERRITIN: FERRITIN (ARMC): 273 ng/mL (ref 8–388)

## 2014-10-27 LAB — LACTATE DEHYDROGENASE, PLEURAL OR PERITONEAL FLUID: LDH, Body Fluid: 68 U/L

## 2014-10-28 LAB — BASIC METABOLIC PANEL
Anion Gap: 5 — ABNORMAL LOW (ref 7–16)
BUN: 43 mg/dL — ABNORMAL HIGH (ref 7–18)
CHLORIDE: 97 mmol/L — AB (ref 98–107)
CO2: 34 mmol/L — AB (ref 21–32)
CREATININE: 2.15 mg/dL — AB (ref 0.60–1.30)
Calcium, Total: 8.4 mg/dL — ABNORMAL LOW (ref 8.5–10.1)
EGFR (African American): 29 — ABNORMAL LOW
EGFR (Non-African Amer.): 24 — ABNORMAL LOW
Glucose: 359 mg/dL — ABNORMAL HIGH (ref 65–99)
Osmolality: 297 (ref 275–301)
Potassium: 5 mmol/L (ref 3.5–5.1)
SODIUM: 136 mmol/L (ref 136–145)

## 2014-10-28 LAB — CBC WITH DIFFERENTIAL/PLATELET
Basophil #: 0 10*3/uL (ref 0.0–0.1)
Basophil %: 0.5 %
Eosinophil #: 0 10*3/uL (ref 0.0–0.7)
Eosinophil %: 0.8 %
HCT: 27.2 % — ABNORMAL LOW (ref 35.0–47.0)
HGB: 9 g/dL — AB (ref 12.0–16.0)
LYMPHS ABS: 0.8 10*3/uL — AB (ref 1.0–3.6)
Lymphocyte %: 15 %
MCH: 28.5 pg (ref 26.0–34.0)
MCHC: 32.9 g/dL (ref 32.0–36.0)
MCV: 87 fL (ref 80–100)
MONO ABS: 0.3 x10 3/mm (ref 0.2–0.9)
Monocyte %: 6.6 %
NEUTROS ABS: 3.9 10*3/uL (ref 1.4–6.5)
Neutrophil %: 77.1 %
Platelet: 142 10*3/uL — ABNORMAL LOW (ref 150–440)
RBC: 3.15 10*6/uL — AB (ref 3.80–5.20)
RDW: 14.8 % — AB (ref 11.5–14.5)
WBC: 5.1 10*3/uL (ref 3.6–11.0)

## 2014-10-29 LAB — URINE CULTURE

## 2014-10-30 LAB — BASIC METABOLIC PANEL
Anion Gap: 5 — ABNORMAL LOW (ref 7–16)
BUN: 52 mg/dL — ABNORMAL HIGH (ref 7–18)
CHLORIDE: 96 mmol/L — AB (ref 98–107)
CREATININE: 2.33 mg/dL — AB (ref 0.60–1.30)
Calcium, Total: 8.3 mg/dL — ABNORMAL LOW (ref 8.5–10.1)
Co2: 33 mmol/L — ABNORMAL HIGH (ref 21–32)
EGFR (African American): 26 — ABNORMAL LOW
EGFR (Non-African Amer.): 22 — ABNORMAL LOW
Glucose: 237 mg/dL — ABNORMAL HIGH (ref 65–99)
Osmolality: 290 (ref 275–301)
Potassium: 4.4 mmol/L (ref 3.5–5.1)
Sodium: 134 mmol/L — ABNORMAL LOW (ref 136–145)

## 2014-10-31 LAB — BASIC METABOLIC PANEL
Anion Gap: 5 — ABNORMAL LOW (ref 7–16)
BUN: 64 mg/dL — ABNORMAL HIGH (ref 7–18)
CHLORIDE: 95 mmol/L — AB (ref 98–107)
Calcium, Total: 8.2 mg/dL — ABNORMAL LOW (ref 8.5–10.1)
Co2: 34 mmol/L — ABNORMAL HIGH (ref 21–32)
Creatinine: 2.41 mg/dL — ABNORMAL HIGH (ref 0.60–1.30)
EGFR (African American): 25 — ABNORMAL LOW
EGFR (Non-African Amer.): 21 — ABNORMAL LOW
Glucose: 233 mg/dL — ABNORMAL HIGH (ref 65–99)
OSMOLALITY: 294 (ref 275–301)
Potassium: 4.1 mmol/L (ref 3.5–5.1)
Sodium: 134 mmol/L — ABNORMAL LOW (ref 136–145)

## 2014-10-31 LAB — CULTURE, BLOOD (SINGLE)

## 2014-10-31 LAB — HEMOGLOBIN A1C: HEMOGLOBIN A1C: 6.4 % — AB (ref 4.2–6.3)

## 2014-10-31 LAB — BODY FLUID CULTURE

## 2014-11-01 LAB — CBC WITH DIFFERENTIAL/PLATELET
BASOS ABS: 0.1 10*3/uL (ref 0.0–0.1)
Basophil %: 0.7 %
EOS PCT: 3.6 %
Eosinophil #: 0.3 10*3/uL (ref 0.0–0.7)
HCT: 26.7 % — AB (ref 35.0–47.0)
HGB: 8.9 g/dL — ABNORMAL LOW (ref 12.0–16.0)
Lymphocyte #: 1.9 10*3/uL (ref 1.0–3.6)
Lymphocyte %: 20.9 %
MCH: 28.3 pg (ref 26.0–34.0)
MCHC: 33.2 g/dL (ref 32.0–36.0)
MCV: 85 fL (ref 80–100)
MONO ABS: 0.6 x10 3/mm (ref 0.2–0.9)
Monocyte %: 7 %
NEUTROS PCT: 67.8 %
Neutrophil #: 6.1 10*3/uL (ref 1.4–6.5)
Platelet: 162 10*3/uL (ref 150–440)
RBC: 3.13 10*6/uL — ABNORMAL LOW (ref 3.80–5.20)
RDW: 15.2 % — ABNORMAL HIGH (ref 11.5–14.5)
WBC: 9 10*3/uL (ref 3.6–11.0)

## 2014-11-01 LAB — BASIC METABOLIC PANEL
ANION GAP: 2 — AB (ref 7–16)
BUN: 70 mg/dL — AB (ref 7–18)
CO2: 37 mmol/L — AB (ref 21–32)
Calcium, Total: 8.1 mg/dL — ABNORMAL LOW (ref 8.5–10.1)
Chloride: 93 mmol/L — ABNORMAL LOW (ref 98–107)
Creatinine: 2.28 mg/dL — ABNORMAL HIGH (ref 0.60–1.30)
GFR CALC AF AMER: 27 — AB
GFR CALC NON AF AMER: 22 — AB
Glucose: 74 mg/dL (ref 65–99)
Osmolality: 284 (ref 275–301)
Potassium: 4.1 mmol/L (ref 3.5–5.1)
SODIUM: 132 mmol/L — AB (ref 136–145)

## 2014-11-01 LAB — OCCULT BLOOD X 1 CARD TO LAB, STOOL: Occult Blood, Feces: POSITIVE

## 2014-11-02 LAB — BASIC METABOLIC PANEL
Anion Gap: 3 — ABNORMAL LOW (ref 7–16)
BUN: 72 mg/dL — ABNORMAL HIGH (ref 7–18)
CALCIUM: 8.1 mg/dL — AB (ref 8.5–10.1)
Chloride: 94 mmol/L — ABNORMAL LOW (ref 98–107)
Co2: 36 mmol/L — ABNORMAL HIGH (ref 21–32)
Creatinine: 2.35 mg/dL — ABNORMAL HIGH (ref 0.60–1.30)
EGFR (African American): 26 — ABNORMAL LOW
GFR CALC NON AF AMER: 22 — AB
Glucose: 98 mg/dL (ref 65–99)
Osmolality: 288 (ref 275–301)
Potassium: 4.7 mmol/L (ref 3.5–5.1)
Sodium: 133 mmol/L — ABNORMAL LOW (ref 136–145)

## 2014-11-20 ENCOUNTER — Ambulatory Visit: Payer: Self-pay | Admitting: Oncology

## 2015-02-09 NOTE — Consult Note (Signed)
Brief Consult Note: Diagnosis: Left distal tibial /fibular shaft fracture.   Comments: Syncopal episode.  Severe pain and swelling. Multiple medical problems on this admission.  PE: Large fracture blister over anterior aspect of tibia. Sensation grossly intact.  Moves foot to command. Obvious deformity with valgus angulation. Slight purplish discoloration of foot, equal bilaterally. Foot warm.  Cap refil 3-4 seconds. Unable to palpate DP or PT pulses on either foot. Posterior splint placed in ED long leg.   New splint will be applied.  New radiographs will be ordered.  Would like to proceed with an external fixator tomorrow and then possible delayed fixation based on soft tissue condition.   Elevate, move toes, ice.  NWB.  Follow up in office in 7-10 days for skin re-check. If still looking poor, will likely proceed to cast.  If looking improved will consider plating vs. arranging for patient to be seen by trauma surgeon/foot and ankle surgeon for definitive treatment.  Will follow as inpatient..  Electronic Signatures: Dawayne Patricia (MD)  (Signed 28-Aug-14 16:03)  Authored: Brief Consult Note   Last Updated: 28-Aug-14 16:03 by Dawayne Patricia (MD)

## 2015-02-09 NOTE — Discharge Summary (Signed)
PATIENT NAME:  Jeanette Hester, Jeanette Hester MR#:  [id number] DATE OF BIRTH:  1941/01/28  DATE OF ADMISSION:  04/15/2013 DATE OF DISCHARGE:  04/19/2013   PRIMARY CARE PHYSICIAN: Anderson Malta A. Gilford Rile, MD, [facility].  PRESENTING COMPLAINT: Altered mental status, uncontrolled sugars at home.   DISCHARGE DIAGNOSES:  1. Acute encephalopathy secondary to dehydration and severely uncontrolled diabetes. Mentation back to normal.  2. Severe hyperglycemia in the setting of type 2 diabetes.  3. Acute on chronic renal failure with hyponatremia, hypochloremia,  improved.  4. History of cerebrovascular accident.  5. Morbid obesity.  6. Hypothyroidism.  7. Chronic pain syndrome.   CODE STATUS: Full code.   DIET: 2 grams sodium, ADA 1800 calorie diet.   ACTIVITIES: Physical therapy.   FOLLOWUP:  1. Follow up with Dr. Gilford Rile after discharge from rehab.  2. Physical therapy.   LABORATORY DATA: Glucose is 189, BUN is 28, creatinine is 1.34, sodium 145, potassium 3.1, chloride is 109, bicarbonate is 26, calcium is 10.1. White count is 9.1 H and H is 11.2 and 32.8, platelet count is 152. Hemoglobin A1c is 8.1. TSH is 1.67. CT of the head: No acute intracranial abnormality. EKG: Normal sinus rhythm. Urine culture: Positive for yeast. UA positive for 15 WBCs.   MEDICATIONS AT DISCHARGE:  1. Nexium 40 mg daily.  2. Oxycodone 15 mg q.i.d.  3. Fenofibric acid 135 mg daily.  4. Lasix 60 mg daily.  5. K-Dur 20 mEq p.o. daily.  6. Remeron 30 mg q.h.s.  7. Gabapentin 300 mg 1 tablet in the morning and 3 tablets at bedtime.  8. Lovaza 1 gram daily.  9. Sliding scale insulin.  10. Tramadol 50 mg q.8 p.r.n.  11. Aspirin 81 mg daily.  12. Plavix 75 mg daily.  13. Duloxetine 60 mg daily.  14. Effexor-XR 150 mg daily.  15. Allegra 180 mg daily.  16. Zocor 40 mg daily.  17. Levothyroxine 112 mcg p.o. daily.  18. Lantus 60 units q.24.  19. Vitamin B12 1000 mg p.o. daily.  MEDICATIONS DISCONTINUED:  Metformin because of elevated creatinine.    BRIEF SUMMARY OF HOSPITAL COURSE:  1. Jeanette Hester is a 74 year old obese Caucasian female with history of hypertension, diabetes, hypothyroidism and diabetic neuropathy, brought in from home due to refusing to take her medication, severe altered mental status along with severe hyperglycemia and dehydration. She was admitted with acute encephalopathy, which was likely related to her uncontrolled sugars and dehydration. No infection was identified. She was started on IV fluids, and mentation improved after 36 to 48 hours. Her creatinine also improved. She is back to her normal mentation.  2. Severe hyperglycemia in the setting of type 2 diabetes with altered mental status. The patient initially was placed on insulin drip, IV fluids. Once her sugars were stable, she was changed back to Lantus along with her sliding scale. Metformin was discontinued since creatinine was up.  3. Acute renal failure with hyponatremia and hypochloremia due to poor p.o. intake, resolved after IV fluids. Creatinine was 1.75, at discharge was 1.4. Her Lasix has been resumed; however, I cut down the dose to 60 mg daily.  4. History of cerebrovascular accident/transient ischemic attack, on Plavix and aspirin. CT head negative.  5. Hyperlipidemia, on fenofibrate, statin and Lovaza.  6. Hypothyroidism. Synthroid was continued.  7. Chronic pain. The patient follows up with [facility]. Her OxyContin was resumed.  8. Generalized weakness, deconditioning. PT recommended rehab. The patient will be going to  [place] at discharge.   TIME SPENT: 40 minutes.   ____________________________ Hart Rochester Posey Pronto, MD sap:OSi D: 04/19/2013 12:00:51 ET T: 04/19/2013 12:23:55 ET JOB#: [id number]  cc: Dani Danis A. Posey Pronto, MD, <Dictator> Eduard Clos. Gilford Rile, MD Ilda Basset MD ELECTRONICALLY SIGNED 05/05/2013 7:33
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
## 2015-02-09 NOTE — Consult Note (Signed)
PATIENT NAME:  Jeanette Hester, Jeanette Hester MR#:  [id number] DATE OF BIRTH:  08-06-1941  DATE OF CONSULTATION:  04/16/2013  REFERRING PHYSICIAN:   CONSULTING PHYSICIAN:  Mckynna Vanloan K. Franchot Mimes, MD  PLACE OF DICTATION:  [place], [place], room [phone].  AGE:  74 years.  SEX:  Female.  RACE:  White.  SUBJECTIVE:  The patient was seen in her room.  Staff reports that the patient's blood sugar was initially 500 because she has not been taking her medications and she has been off of her medications several  days ago.  Tonight her blood sugars are 279.  She is being watched by a sitter one-on-one basis.  Sitter reports that she is confused and she cannot identify people and cannot give her name.  All her responses are basically yes and no.  When sitter asked if she wanted water she said yes.  When sitter asked if she had pain she said yes, but she could not point where the pain was.    OBJECTIVE:   The patient is lying in bed wearing hospital gown.  Drowsy, but arousable and she is making noises which do not make much sense.  When she was asked her name she could not tell her name.  She barely opened her eyes, but closed back and continued to make noises.  She appears to be very confused and as stated above by the sitter she can barely answer yes or no questions and gets agitated real easy.  Further mental status could not be done at this time.  IMPRESSION:  Confusion and anxiety, probably related to blood sugars and medical problems, rule out delirium.   RECOMMENDATIONS:  Haldol 1 mg by mouth q. 6 hours by mouth or IM as needed for agitation, Ativan 1 mg by mouth or IM q. 6 hours as needed for agitation.  Observe and hopefully after her blood sugars are stabilized she will be more alert and further information can be obtained as patient lives by herself.      ____________________________ Wallace Cullens. Franchot Mimes, MD skc:ea D: 04/16/2013 21:24:20 ET T: 04/17/2013 01:48:18 ET JOB#: [id number]  cc: Arlyn Leak K. Franchot Mimes,  MD, <Dictator> Dewain Penning MD ELECTRONICALLY SIGNED 04/17/2013 17:30

## 2015-02-09 NOTE — Discharge Summary (Signed)
ADDENDUM  PATIENT NAME:  Jeanette Hester, Jeanette Hester MR#:  [id number] DATE OF BIRTH:  Oct 07, 1941  DATE OF ADMISSION:  06/16/2013 DATE OF DISCHARGE:  06/21/2013  The patient dud not get discharged yesterday as we did not have [facility]SR back from [facility], so she was kept and she will be discharged today as that has come back and they will accept her today. In regards to her diabetes, she states as an outpatient she is on 63 units of Lantus. Here, she is on way less. Her sugars are running high and upon discharge I will go ahead and increase the Lantus to her outpatient dose. She is also going to be discharged on sliding scale insulin. In regards to following up with trauma surgery, we will attempt to make the appointment for her. She, at this point, has an appointment tomorrow for the orthopedics. She is going to be discharged on enoxaparin 40 mg every 24 hours and this should be continued until she follows up with the trauma surgeons and we have definitive plan in regards to further surgery as warranted per orthopedics. On the day of discharge, the patient has no shortness of breath or pains in the abdomen and states that leg pain is stable.   PHYSICAL EXAMINATION:   VITAL SIGNS: Temperature today is 99.3, pulse rate 63, respiratory rate 18, blood pressure 164/73 and oxygen saturation 92% on room air.  GENERAL: The patient is an obese, Caucasian female, sitting on bed, in no obvious distress, talking in full sentences.  HEENT: Normocephalic, atraumatic. Moist mucous membranes.  NECK: Supple. LUNGS: Clear to auscultation without wheezing, rhonchi or rales.  ABDOMEN: Soft, obese, nontender, nondistended. Positive bowel sounds in all quadrants.  EXTREMITIES: Left lower extremity has external fixator and wrapped to below the knee. No significant edema on the right.   At this point, she will be discharged with outpatient followup.   TOTAL TIME SPENT: 35 minutes.   CODE STATUS: The patient is full code.   ____________________________ Vivien Presto, MD sa:aw D: 06/22/2013 10:12:53 ET T: 06/22/2013 10:28:45 ET JOB#: [id number]  cc: Vivien Presto, MD, <Dictator> Vivien Presto MD ELECTRONICALLY SIGNED 07/12/2013 13:50

## 2015-02-09 NOTE — H&P (Signed)
PATIENT NAME:  Jeanette Hester, Jeanette Hester MR#:  [id number] DATE OF BIRTH:  1941-05-10  DATE OF ADMISSION:  06/16/2013  ADDENDUM:  The patient likely has mild rhabdomyolysis so we will hold the statins and TriCor.  ____________________________ Epifanio Lesches, MD sk:sb D: 06/16/2013 08:48:04 ET T: 06/16/2013 09:00:44 ET JOB#: [id number]  cc: Epifanio Lesches, MD, <Dictator> Epifanio Lesches MD ELECTRONICALLY SIGNED 07/18/2013 4:46
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
## 2015-02-09 NOTE — Consult Note (Signed)
Brief Consult Note: Diagnosis: difficult historian. sufferred a fall, probably mechanical. elevated cpk/mb with minimal troponin elevation. Appears to be demand ischemia with no chest pain or ekg changes.   Patient was seen by consultant.   Recommend further assessment or treatment.   Discussed with Attending MD.   Comments: 74 yo female who sufferred a fall. Elevated cpk/mb apperas to be secondary to rhabdomyolysis. Elevated troponin was minimial with no chest pain or ekg changes. Appears to be demand ischemia. Will convert lovenox to prophylaxis dose and continue with beta blockers. Proceed with treatemnt of orthopedic problems as planned. Full note to follow.  Electronic Signatures: Teodoro Spray (MD)  (Signed 28-Aug-14 16:53)  Authored: Brief Consult Note   Last Updated: 28-Aug-14 16:53 by Teodoro Spray (MD)

## 2015-02-09 NOTE — Op Note (Signed)
PATIENT NAME:  Jeanette Hester, NOLET MR#:  [id number] DATE OF BIRTH:  May 16, 1941  DATE OF PROCEDURE:  06/19/2013  PREOPERATIVE DIAGNOSIS: Comminuted fracture distal tibial metaphysis, fibular shaft, distal fibula of left lower extremity.   POSTOPERATIVE DIAGNOSIS: Comminuted fracture distal tibial metaphysis, fibular shaft, distal fibula of left lower extremity.  PROCEDURE PERFORMED: Application of external fixator, with closed reduction left lower extremity fracture.   SURGEON:  Dawayne Patricia, MD  ASSISTANT: None.   IMPLANTS: [employer] and delta frame, large external fixator  ANESTHESIA: General.   ESTIMATED BLOOD LOSS:  25 mL.   TOURNIQUET TIME:  Zero.   COMPLICATIONS: No immediate intraoperative or postoperative complications noted.   FINDINGS: The patient has a large superficial wound on the anterior aspect of the distal tibia consistent with a ruptured fracture blister. She also has 2 additional very large fracture blisters, each measuring approximately 3 inches x 3 inches on the anterior and lateral aspect of the lower extremity. The fracture is extremely unstable. External fixator placed with a calcaneal pin and 2 tibial pins.   DISPOSITION: The patient will be non-weightbearing. She will be discharged to a skilled nursing facility, per medical status. She will follow up with Trauma Surgery at [facility].   INDICATIONS FOR PROCEDURE:  Jeanette Hester is a 74 year old female who sustained a distal tibia/fibula fracture after a fall in the bathroom. She previously had an ORIF of a supracondylar fracture on the same side, and had some difficulty healing. She presented to the Emergency Room after the syncopal episode that caused her fall. She was admitted to the Medical service  of the telemetry unit for workup for an MI. Medical clearance was obtained to proceed with external fixation. Given the patient's soft tissue status with excessive swelling and multiple fracture blisters, definitive  fixation will have to be deferred.   DESCRIPTION OF PROCEDURE:  Jeanette Hester was identified in the preoperative holding area. A long-leg and sugar tong splint had been applied by me the day prior to procedure. The lower extremity was marked.  The patient was brought into the Operating Room, placed on the table in supine position. General anesthesia was administered. All dressings were removed. The left lower extremity was prepared and draped in the usual sterile fashion. All fracture blisters as well as the unroofed fracture blister with superficial wound were sealed with Ioban prior to doing any invasive work.   Surgical timeout was performed identifying patient, procedure, consent form, imaging, laterality, antibiotics and allergies.   Fluoroscopy was brought in, and the fracture site was visualized and marked. Distal aspect of the tibial tubercle was visualized and marked. The approximate length of the ultimate plate that may be required for definitive fixation was estimated, and a mark was made about the proximal aspect of this. A small longitudinal incision was made at the proximal tibia just medial to the tibial spine and distal to the tibial tubercle. Blunt dissection was carried down to bone. Bone was gently cleared of soft tissue. A drill was used to penetrate the outer cortex. A 5.5 pin was then inserted by hand and taken through the posterior cortex. Placement of the pin was confirmed on AP and lateral radiographs. The bone was found to be quite soft in quality. However, the pin did appear to achieve good purchase in the far cortex. A 5-hole pin-to-pin clamp was used, and a second pin was placed in a similar fashion parallel to the original pin. Attention was now turned to the calcaneus.  Under fluoroscopic guidance, a 5.5 mm pin with threading in the middle was taken across the calcaneus until the threads were seated in the bone. Again, the bone was found to be extremely soft. However, some  purchase was achieved in the calcaneus. A standard delta frame was made between the tibial pin and the calcaneal pin, under fluoroscopic guidance, care was taken to ensure that the fracture was out to length. There was some mild medial translation of the distal fragment. Alignment was adequate on lateral view. Given that the pins were placed so far apart in order to allow for definitive plate fixation, rigid stability of the fracture was unable to be achieved. There was significantly more stability than preoperatively. However, decision was made to place a posterior splint to add another element of stability to the fracture in order to minimize swelling and soft tissue damage. At this time, all wounds were copiously irrigated and cleaned. Sterile dressings were applied. Posterior splint was applied with adequate padding. The patient was woken and transferred to the postoperative care unit in stable condition.   When the patient is medically cleared, she will likely be discharged to a skilled nursing facility. We will arrange for her followup appointment with Dr. Janalyn Rouse, who has agreed to accept her care as of next Wednesday. If she is not yet discharged from the hospital, she will follow up with Dr. Ginette Pitman the following Monday.   ____________________________ Dawayne Patricia, MD sr:mr D: 06/19/2013 08:47:53 ET T: 06/19/2013 20:45:56 ET JOB#: [id number]  cc: Dawayne Patricia, MD, <Dictator> Dawayne Patricia MD ELECTRONICALLY SIGNED 07/19/2013 15:16

## 2015-02-09 NOTE — Discharge Summary (Signed)
PATIENT NAME:  Jeanette Hester, Jeanette Hester MR#:  [id number] DATE OF BIRTH:  [birth year]  DATE OF ADMISSION:  11/25/2012 DATE OF DISCHARGE: 11/29/2012   PRIMARY CARE PHYSICIAN: Dr. Ronette Deter.  DISCHARGE DIAGNOSES:  1.  Generalized weakness.  2.  Urinary tract infection.  3.  Acute renal failure.  4.  Diabetes.  5.  Diabetic neuropathy.  6.  Obesity.  7.  Chronic back pain from spinal stenosis and herniated disk.   CONDITION: Stable.   CODE STATUS: Full code.   MEDICATIONS:  Please refer to the [facility] discharge instruction medication reconciliation list.   DIET: ADA diet.   ACTIVITY: As tolerated.   FOLLOWUP CARE: Follow up with PCP within 1 to 2 weeks.   REASON FOR ADMISSION: Generalized weakness, inability to stand.   HOSPITAL COURSE: The patient is a 74 year old Caucasian female with a history of diabetes, diabetic neuropathy, chronic back pain, arthritis, anxiety, who presented to the ED with extreme weakness and dysuria for 3 days. The patient could not even move out of her recliner and the patient was noted to have a UTI and was admitted for generalized weakness and UTI. After admission, the patient had been treated with Rocephin and urine culture showed E. coli sensitive to Rocephin. The patient's symptoms have much improved after Rocephin treatment. For generalized weakness, the patient has been treated with physical therapy, but still has weakness. The patient needs be placed in a rehab facility. The patient developed acute renal failure, which is possibly due to prerenal etiology, so Lasix and metformin was on hold. The patient was treated with IV fluid support. BUN decreased from 28 to 18 and creatinine decreased from 1.59 to 1.28. For diabetes, the patient has been treated with sliding scale and Lantus. The patient will be discharged to a nursing home today. I discussed the patient's discharge plan with the patient and the case manager.   TIME SPENT: About 36 minutes.     ____________________________ Demetrios Loll, MD qc:aw D: 11/29/2012 10:40:23 ET T: 11/29/2012 10:52:00 ET JOB#: [id number]  cc: Demetrios Loll, MD, <Dictator> Demetrios Loll MD ELECTRONICALLY SIGNED 11/29/2012 16:59

## 2015-02-09 NOTE — Discharge Summary (Signed)
PATIENT NAME:  Jeanette Hester, Jeanette Hester MR#:  [id number] DATE OF BIRTH:  1940/12/10  DATE OF ADMISSION:  06/16/2013 DATE OF DISCHARGE:  06/21/2013  DISPOSITION: To rehab.   DISCHARGE DIAGNOSES: 1.  Left tibia and fibula fracture, with external fixation.  2.  Osteoarthritis.  3.  History of depression and anxiety.  4.  Insulin-dependent diabetes mellitus.  5.  Hyperlipidemia. 6.  Hypothyroidism.  7.  Chronic low back pain.  8.  History of syncope, likely secondary to hypoglycemia.  8.  Rhabdomyolysis, which resolved.   CONSULTATIONS: Cardiology consult with Dr. Ubaldo Glassing; orthopedic consult with Dr. Dawayne Patricia.   PATIENT'S DISCHARGE MEDICATIONS:  1. Aspirin 81 mg daily.  2. Lanoxin 60 mg daily.  3. TriCor 140 mg p.o. daily.  4. Neurontin 300 mg p.o. b.i.d.  5.  Synthroid 112 mcg p.o. in the morning.  6.  Effexor-XR 150 mg p.o. daily.  7.  Pantoprazole 40 mg daily.  8.  Oxycodone 15 mg p.o. p.r.n. for moderate pain.  9.  Lovaza 1 gram p.o. daily.  10. Calcium with vitamin D, 1 tablet p.o. b.i.d. 11. Lovenox 40 mg q.24 hours.  12. Insulin, sliding-scale coverage.  13.  Colace 100 mg p.o. twice daily.  14. Lantus, 12 units at bedtime, should be increased to Lantus to 18 units at bedtime.  15.  Plavix 75 mg p.o. daily.  16.  Metformin 500 mg p.o. b.i.d.  17.  Mirtazapine; stop the mirtazapine.   18.  Simvastatin 40 mg p.o. daily.   HOSPITAL COURSE:  1.  The patient is a 74 year old female patient with multiple medical problems, came in because of syncope and admitted on August 28th by me. Primary doctor,  Dr.  Ronette Deter. The patient was living at [facility] before, then went home. The patient was admitted for syncope and a tibia and fibular fracture of the left leg. Syncope was thought to be secondary to dehydration and hypoglycemia, and also related to her neuro pain medications of mirtazapine and oxycodone at home. The patient was admitted to the hospitalist service. Started her  on fluids, started on psych medicines. Remeron was stopped, and CT head was unremarkable. The patient's troponin was 0.18 on admission, but did not have any chest pain or EKG changes.  The patient was seen by cardiology, Dr. Ubaldo Glassing, who recommended that she has rhabdomyolysis, with a CK of 328. Her troponins were likely secondary to her rhabdomyolysis that happened after the fall. Her symptoms improved and the patient's CK went up to 285, but then improved with IV fluids. The patient's rhabdomyolysis improved with fluids.  2. Elevated troponins, secondary to rhabdomyolysis. Echocardiogram showed EF of 60% to 42%, with diastolic relaxation pattern, The patient's chest pain resolved, and her CK total also improved to 634 on August 30th.  3.  Fall, with left tibia and fibula fracture: The patient's left tibia and fibula showed a comminuted fracture of the distal tibia, and also a comminuted fracture of the fibula. The patient was seen by Dr. Dawayne Patricia. The patient had external fixation done by Dr. Joie Bimler on the 29th,  and the patient internal fixation was delayed because of soft tissue edema. The patient needs to see a trauma surgeon in [place]. Right now she does have external fixation. Sees orthopedic doctor on a daily basis. The patient right now is slowly progressing with physical therapy. Does have external fixators. She needs to go to rehab. Case manager is working on rehab placement. Right now she needs to  follow with the trauma doctor for [facility] on Wednesday. If she goes to rehab today she needs to follow up with the orthopedic physician in [place]. We will get the appointment fixed before she goes to rehab. 4.  Hypothyroidism: Continue Synthroid.  5. History of IDDM: Sugars have been in the 400s range. Initially the Lantus was stopped, and also metformin was stopped. Metformin was stopped because of her rhabdomyolysis. The patient's sugars were around 500, so Lantus was increased and  metformin should be re-started when she is in rehab.   6. Acute renal failure, secondary to rhabdomyolysis: Improved with fluids. Her recent creatinine is 1.20 this morning. 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 Renal function at this time is stable.  7. History of TIA: She is on aspirin and Plavix. That can be continued.  8. Hyperlipidemia: Initially her statins were stopped due to rhabdomyolysis, but they can be restarted.  9. History of depression: The patient's mirtazapine was stopped because of sedation effect. Right now she is doing good. She can be on Cymbalta; continue Cymbalta 60 mg daily.   LABORATORY DATA: On August 28th BUN 49, creatinine 1.84, white count 6.9, hemoglobin 12.4, hematocrit 36.3.   Chest x-ray shows no acute disease of the chest.   X-ray of tibia and fibula shows comminuted fracture of tibia and also fibula and distal diaphysis.   CT head, unremarkable.   EKG: Normal sinus rhythm at 60 beats per minute.   CT tibia showed comminuted fracture of tibia and fibula on the left side.   The patient's troponin peaked up to 0.25, then dropped to 0.18. The patient's CK was 1904 on August 28th, and on the 28th again it was 2085, which improved with fluids.   Echo showed EF of 60% to 65%, normal LV function, with impaired relaxation. Time spent on discharge preparation: More than 30 minutes.    ____________________________ Epifanio Lesches, MD sk:dm D: 06/21/2013 10:02:48 ET T: 06/21/2013 10:34:01 ET JOB#: [id number]  cc: Epifanio Lesches, MD, <Dictator> Eduard Clos. Gilford Rile, MD Dawayne Patricia, MD Javier Docker. Ubaldo Glassing, MD Epifanio Lesches MD ELECTRONICALLY SIGNED 07/07/2013 23:16
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
## 2015-02-10 NOTE — Discharge Summary (Signed)
PATIENT NAME:  Jeanette Hester, Jeanette Hester MR#:  [id number] DATE OF BIRTH:  January 27, 1941  DATE OF ADMISSION:  02/02/2014 DATE OF DISCHARGE:  02/04/2014  REASON FOR ADMISSION:  Altered mental status.   DISCHARGE DIAGNOSES:  Altered mental status secondary to metabolic encephalopathy, which is likely due to:  1.  Dehydration.  2.  Acute kidney failure.  3.  Accumulation of pain medication due to the dehydration and the acute kidney failure.  4.  Polypharmacy.    OTHER MEDICAL DIAGNOSES:  1.  Urinary tract infection, was ruled out.  2.  Type 2 diabetes.  3.  Hypoglycemia.  4.  Chronic debility.  5.  Chronic back pain.  6.  Status post ankle fracture requiring prolonged recovery in the skilled nursing facility.  7.  Previous transient ischemic attacks.  8.  History of shingles.  9.  History of colon and rectal cancer.  10.  Diabetic neuropathy.  11.  History of arthritis.  12.  Allergies. 13.  Hypothyroidism.  14.  Spinal stenosis.   DISPOSITION:  Nursing home.   MEDICATIONS AT DISCHARGE:   1.  Nexium 40 mg once a day. 2.  Vitamin B12 once daily.  3.  Lovaza one twice daily.  4.  Acetaminophen 325 mg 2 tablets every four hours as needed for pain or fever. 5.  Zyrtec 10 mg once daily.  6.  Senna 2 tablets twice daily.  7.  Colace 100 mg twice daily.  8.  Aspirin 81 mg once a day.  9.  Plavix 75 mg once a day.  This is due to TIAs.  10.  Cymbalta 30 mg once daily.   11.  Mysoline.  12.  Vitamin C.  13.  Calcium.  14.  Multivitamins.  15.  Oxycodone 5 mg one every eight hours of the immediate release.  The patient was taking 15 every six hours.  16.  Gabapentin 300 mg every eight hours.  This is a change as the patient was taking this 4 times a day instead.  17.  Lantus 35 units subcutaneously once a day at night.  This is a decrease in the dose as well due to hypoglycemia.  The patient was taking 50 units.  18.  Insulin sliding scale with Humulin R.  Take no insulin for blood sugars below  150, 2 units for 150 to 199, 4 units for 200s to 250 blood sugars, 6 units for 250 to 300 blood sugars, 8 units for 350 to 400.  Call the doctor for anything above 400s.   FOLLOW-UP:  With primary care physician in the next couple of days at the nursing home and referral for pain management in [place] where the patient is to be set up for adjustment of medications.   HOSPITAL COURSE:  A 74 year old female with history of hypertension, hyperlipidemia, diabetes, insulin-dependent who came to the Emergency Department with altered mental status, obtunded, unable to get records.  When she arrived to the Emergency Department she had blood sugars were in the 500s for what insulin was given.  The patient had a temperature of 99.8 and it was thought that she could have a urinary tract infection.    Her urinalysis was not significantly abnormal, only 8 white blood cells, with 13 red blood cells, some bacteria, it looked more like contamination and the urine culture had no growth.  She received one dose of antibiotics in the ER and we did not continue those.  The patient actually started improving after IV  fluids.     She was found to be in acute kidney injury with a creatinine of 1.68.  Her previous creatinine was ranging around 1.3 to 1.5.  At discharge her creatinine is 1.31.  Her BUN is 21.  Her GFR is around 47.  LFTs within normal limits except for a slight elevation of alkaline phosphatase at 178.  Her hemoglobin was 9.7 on admission, 8.3 at discharge.  This was due to hemoglobin dilution.  The patient had low platelets which could be secondary to consumption, 111 at discharge.  Chest x-ray did not show any signs of pneumonia or problems.  The patient had an episode of hypoglycemia while on the hospital, down to 45.  She says she was eating normally for what we decreased her insulin, instead of 50 units to 35 units.  This could be increased slowly if the patient has uncontrolled diabetes, but again, has to be  done cautiously.    For her other medical problems, the patient was stable.  She actually cleared up after IV fluids were given and after some of her medications were held.  Pain medications and gabapentin were cut back down on the doses and the patient seems to be comfortable, not having any significant pain after doing these changes.   TIME SPENT:  I spent about 45 minutes discharging this patient today.     ____________________________ [place] Sink, MD rsg:ea D: 02/04/2014 12:41:04 ET T: 02/04/2014 22:33:03 ET JOB#: [id number]  cc: Lena Sink, MD, <Dictator> Haniel Fix America Brown MD ELECTRONICALLY SIGNED 02/05/2014 5:25

## 2015-02-10 NOTE — H&P (Signed)
PATIENT NAME:  ALAZAY, LEICHT MR#:  [id number] DATE OF BIRTH:  [birth year]  DATE OF ADMISSION:  09/13/2014  ADDENDUM:    The patient's chest x-ray was consistent with congestive heart failure.   DIAGNOSIS: So her diagnosis now is acute congestive heart failure causing a chronic hypoxia, likely acute on chronic diastolic heart failure as evidenced by the echocardiogram about a year ago.   I will repeat echocardiogram. I have started Lasix 40 IV q. 12 hours. I will also go ahead and consult nephrology just in case this fluid maybe secondary to her renal disease. I have ordered BMP and CBC to be performed. I will hold her torsemide as I am starting her on Lasix. Also for her lower extremity edema I will go ahead order Dopplers to make sure she does not have a deep vein thrombosis.    ____________________________ Donell Beers. Benjie Karvonen, MD spm:nt D: 09/13/2014 15:36:29 ET T: 09/13/2014 15:57:41 ET JOB#: [id number]  cc: Mkenzie Dotts P. Benjie Karvonen, MD, <Dictator> Donell Beers Exie Chrismer MD ELECTRONICALLY SIGNED 09/13/2014 16:42
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
## 2015-02-10 NOTE — Discharge Summary (Signed)
PATIENT NAME:  Jeanette Hester, Jeanette Hester MR#:  [id number] DATE OF BIRTH:  24-Dec-1940  DATE OF ADMISSION:  09/13/2014 DATE OF DISCHARGE:  09/18/2014  PRESENTING COMPLAINT: Shortness of breath.   DISCHARGE DIAGNOSES: 1.  Acute hypoxic respiratory failure secondary to acute on chronic congestive heart failure, diastolic, improving.  2.  Chronic respiratory failure on home oxygen 2 liters nasal cannula.  3.  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  4.  Chronic kidney disease, stage V.  5.  Hypothyroidism.  6.  History of transient ischemic attack.  7.  Diabetic neuropathy and nephropathy.   PROCEDURES: Left brachiocephalic fistula placed by Dr. Delana Meyer on 25th of November.   DISCHARGE INSTRUCTIONS: Follow up with nephrology, Dr. Candiss Norse and Dr. Holley Raring in 7 to 10 days.   DISCHARGE DIET: Renal, 1800 calorie 2 gram sodium diet.   CODE STATUS: Limited code.   DISCHARGE MEDICATIONS: 1.  Demadex 20 mg b.i.d.  2.  Roxicodone 5 mg q. 4 hours p.r.n.  3.  Januvia 50 mg daily.  4.  Insulin Lantus 35 units at bedtime.  5.  Sliding scale insulin.  6.  Vitamin D3 1,000 international units daily.  7.  Vitamin C 500 daily. 8.  Calcium with vitamin D 200 international units p.o. b.i.d.  9.  Vitamin B12 1,000 mcg p.o. daily.  10.  Zyrtec 10 mg daily.  11.  Docusate 100 mg b.i.d.  12.  Lovaza 1 gram daily.  13.  Nexium 40 mg p.o. daily.  14.  Systane balance 1 drop left eye every 6 hours.  15.  Tramadol 50 mg q. 6 p.r.n.  16.  Senokot 1 tablet b.i.d. p.r.n.  17.  Aspirin 81 mg daily.  18.  Fluoxetine 30 mg p.o. daily.  19.  Plavix 75 mg daily.  20.  Primidone 50 mg b.i.d.  21.  Synthroid 0.112 p.o. daily.  22.  Gabapentin 100 mg t.i.d.  23.  Chloraseptic spray 1 to 2 p.r.n.  24.  Guaifenesin 10 mL q. 6 p.r.n.  25.  Tylenol 500 mg q. 6 p.r.n.  26.  Keflex 250 mg p.o. t.i.d. for 4 more days.   CONSULTATIONS:  1.  Nephrology - Dr. Candiss Norse.  2.  Dr. Delana Meyer.  BRIEF SUMMARY OF  HOSPITAL COURSE:  1.  Jeanette Hester is a pleasant 74 year old Caucasian female with chronic diastolic congestive heart failure, CKD stage V and type 2 diabetes who was admitted after she was found to be hypoxic and short of breath after she got her left brachiocephalic fistula as part of routine day surgery. The patient was admitted, started on IV Lasix, diuresed well. She appears to be at baseline at present with sats around 99 on 2 liters. The patient was changed to p.o. torsemide 20 mg b.i.d. for now. We will follow up with Dr. Candiss Norse and Dr. Holley Raring as outpatient for further management on her CKD stage V. The rest of her cardiac meds were continued.  2.  Diabetes, complicated with neuropathy, nephropathy. The patient was placed back on her Lantus and sliding scale insulin.  3.  Hypothyroidism. On Synthroid.  4.  TIA. Continued on Plavix. 5.  CKD stage V. Per nephrology the patient does not need to be started on hemodialysis. She just had a left-sided brachiocephalic fistula placed. The patient understands she will be started sooner or later on dialysis down the road.  6.  UTI. Continue Keflex.   Overall hospital stay otherwise remained stable. The patient remained a limited code.  TIME SPENT: 40 minutes.  ____________________________ Hart Rochester Posey Pronto, MD sap:sb D: 09/18/2014 12:48:10 ET T: 09/18/2014 13:32:27 ET JOB#: [id number]  cc: Lourie Retz A. Posey Pronto, MD, <Dictator> Murlean Iba, MD Katha Cabal, MD Ilda Basset MD ELECTRONICALLY SIGNED 09/21/2014 14:10

## 2015-02-10 NOTE — Consult Note (Signed)
   Comments   I called and spoke with pt's son, Denese Mentink [phone]). He admits to having not talked to his mother in at least a year. However, he says he is interested in being involved in his mother's healthcare. He says that he has talked to his mother in the past and she has told him that she would not want to be on a ventilator or resusctiated. Note plan for extubation. Family would not want [place] to reintubate. Will change code status to DNR. I also called and updated Ms. Cottrell.   Electronic Signatures: Mychal Durio, Kirt Boys (NP)  (Signed 17-Dec-15 12:59)  Authored: Palliative Care Phifer, Izora Gala (MD)  (Signed 17-Dec-15 12:53)  Authored: Palliative Care   Last Updated: 17-Dec-15 12:59 by Irean Hong (NP)

## 2015-02-10 NOTE — Discharge Summary (Signed)
PATIENT NAME:  Jeanette Hester, Jeanette Hester MR#:  [id number] DATE OF BIRTH:  1941/01/09  DATE OF ADMISSION:  10/04/2014 DATE OF DISCHARGE:  10/10/2014  CHIEF COMPLAINT: Shortness of breath.   ADMITTING DIAGNOSES: 1.  Acute hypoxic respiratory failure secondary to acute diastolic congestive heart failure.  2.  Diabetes mellitus.  3.  Hyperkalemia.  4.  Hypothyroidism.   DISCHARGE DIAGNOSES: 1.  Acute hypoxic respiratory failure.  2.  Acute on chronic diastolic congestive heart failure.  3.  Left lower lobe pneumonia.  4.  Acute on chronic kidney disease.  5.  Hyperlipidemia.  SECONDARY DISCHARGE DIAGNOSES:  1.  Hypothyroidism.  2.  Diabetes mellitus.   CONSULTATIONS: Pulmonology, palliative care, and nephrology.   PROCEDURES:  1.  The patient is intubated.  2.  Triple lumen catheter placement.   BRIEF HISTORY OF PRESENT ILLNESS: The patient is a 74 year old Caucasian female who was recently hospitalized and discharged on November 30th for acute respiratory failure. The patient was noticed to have shortness of breath, tachypneic. She was initially placed on BiPAP and brought into the ED. She was intubated in the ED as she was still short of breath. Please review history and physical for details.   HOSPITAL COURSE:  1.  Acute hypoxic respiratory failure from acute on chronic diastolic congestive heart failure, as well as pneumonia. The patient was initially placed on BiPAP while bringing  her from [facility]. Since the patient was not doing well, she was intubated in the ED. According to the previous admission notes, the patient is limited code and does not want to be intubated, but as the paperwork is not available from the skilled facility, the patient was intubated. Palliative care consult was placed regarding CODE STATUS calcification. Pulmonology is involved and palliative  care was involved and Dr. Izora Gala Phifer has discussed this with the patient's son, and her CODE STATUS is completely  changed to DO NOT RESUSCITATE. Subsequently, the patient was extubated of off weaning trials, and the patient was transferred to the floor. She needed high-flow oxygen, but subsequently it was weaned to 4 liters. The patient was given IV antibiotics, as she has a left lower lobe pneumonia. The patient was spiking fevers on 10/05/2014. With antibiotics, she became afebrile and her clinical situation slowly improved. IV Lasix was given and daily rates were monitored for acute on chronic diastolic congestive heart failure. As renal function is getting worse, nephrology was also involved. With antibiotics and diuretics, her clinical situation slowly, but progressively improved.  2.  Acute kidney injury and chronic kidney disease. During previous admission, a fistula is placed, as she is progressing towards end-stage renal disease. Nephrology has closely followed her and monitored her renal function, 24-hour urine protein and creatinine was checked. Her renal function being okay. Again, not considering hemodialysis at this time. Overall, she has a poor prognosis, but at this point, they do not think the patient needs immediate hemodialysis.  3.  For hyperkalemia, the patient was given Kayexalate, and potassium is back to normal.  4.  Anemia of chronic disease. The patient has received Procrit per nephrology.  5.  Hypothyroidism. The plan is to continue Synthroid.  6.  For diabetes mellitus, the patient was placed on insulin protocol while she was in ICU and subsequently, she was placed on sliding scale insulin. Overall, her condition is stabilized. The patient is bedbound at baseline, and she uses a wheelchair on her own for ambulation.     PHYSICAL EXAMINATION: VITAL SIGNS: On December  22nd: Temperature 98.3, pulse 61, respirations 18, blood pressure 147/68, pulse oxygen 98% on 2 liters at rest. The patient admitted that she lives on 2-3 liters of oxygen at [facility].  GENERAL APPEARANCE: Not in any  acute distress, obese, bedbound.  HEENT: Normocephalic, atraumatic. Pupils are equally reacting to light and accomodation. Neck is supple with no masses.  LUNGS: :Very minimal crackles at the bases, can be from atelectasis. Not using any accessory muscles.  CARDIOVASCULAR: Regular rate and rhythm. No murmurs.  GASTROINTESTINAL: Soft, obese. Bowel sounds are positive in all 4 quadrants. Nontender, nondistended. No masses felt.  NEUROLOGICAL: Awake, alert, and oriented x 3, follows verbal commands. Motor and sensory are grossly intact.  SKIN: No rashes. No ulcers.  EXTREMITIES: Chronic trace edema is present.  PSYCHIATRIC: Awake, alert, and oriented x 3 with a good insight.   LABORATORIES AND IMAGING STUDIES: Chest x-ray, two views on December 19th: Left lung base opacity consistent with moderate effusion with atelectasis. Pneumonia is possible. Mild right lung base opacity most likely atelectasis. The patient's Accu-Cheks: 671, 24, 160. Laboratories on December 20th: WBC 4.2, hemoglobin 7.6, hematocrit 23.8, platelets are 138. Urine creatinine 47.1. Serum creatinine 2.37. The patient's BUN is 46, creatinine 2.7. Sodium, potassium, and chloride are normal on December 20th. GFR 20, anion gap is 6. Serum osmolality is normal. The patient's left ventricular ejection fraction 60% -65%, which was done during the prior admission.   MEDICATIONS AT THE TIME OF DISCHARGE: Vitamin B12 1000 mcg 1 tablet p.o. once daily, Zyrtec 10 mg once daily, Colace 100 mg p.o. 2 times a day, Synthroid 112 mcg p.o. once daily calcium 600 with vitamin D 1 tablet p.o. 2 times a day, Januvia 50 mg p.o. once daily, vitamin D3 at 1000 international units p.o. once daily, Systane Balance ophthalmic solution 1 drop left eye every 6 hours as needed for dry eyes, Plavix 75 mg once daily, Tylenol 500 mg 1 tablet p.o. every 6 hours as needed for pain or temperature greater than 100.4, aspirin 81 mg once daily, duloxetine 30 mg capsule p.o.  once a day, Demadex 20 mg p.o. 2 times a day, Flonase 2 sprays nasally once daily, Neurontin 100 mg p.o. 3 times a day, Mysoline 50 mg 1-1/2 tablets p.o. 2 times a day, Nexium 40 mg 1 tablet p.o. once daily, NovoLog sliding scale: For 0-200 blood sugar, no coverage; 201-250, 6 units; 251 - 300, 8 units; 301 - 350, 12 units; 351 - 400, 16 units; and 401-500, 20 units. Lovaza 1 capsule p.o. once daily, Senna 8.6 mg 1 tablet p.o. every 12 hours as needed for constipation, vitamin C 500 mg 1 tablet p.o. once daily, oxycodone 5 mg 1 tablet p.o. every 4 hours as needed for pain, Lantus 40 units subcutaneously once daily at bedtime, amlodipine 5 mg once daily, guaifenesin 100 mg/5 mL take 10 mL every 4 hours as needed for cough. doxycycline 100 mg p.o. q. 12 hours.   DIET: Low-sodium, low-fat, diabetic diet, regular consistency.   ACTIVITY: As tolerated. The patient is bedbound and uses wheelchair for ambulation.   FOLLOWUP INSTRUCTIONS: Follow up with primary care physician in 1-2 weeks; with cardiology, Dr. Ubaldo Glassing, in a week; nephrology in 1-2 weeks. Continue 2-3 liters of home oxygen. Follow up with outpatient [facility].   The diagnosis and plan of care was discussed with the patient. She is aware of the plan.    CODE STATUS: DO NOT RESUSCITATE.   TOTAL TIME SPENT ON DISCHARGE  AND COORDINATION OF CARE: 45 minutes.   The patient needs a repeat echocardiogram as an outpatient and she follows up with Dr. Ubaldo Glassing as an outpatient in 1-2 weeks    ____________________________ Nicholes Mango, MD ag:mw D: 10/11/2014 10:42:27 ET T: 10/11/2014 11:29:51 ET JOB#: [id number]  cc: Nicholes Mango, MD, <Dictator> Javier Docker. Ubaldo Glassing, MD Primary Care Physician Nephrology Nicholes Mango MD ELECTRONICALLY SIGNED 10/14/2014 23:30

## 2015-02-10 NOTE — Op Note (Signed)
PATIENT NAME:  Jeanette Hester, Jeanette Hester MR#:  [id number] DATE OF BIRTH:  [birth year]  DATE OF PROCEDURE:  09/13/2014  PREOPERATIVE DIAGNOSES:  1.  End-stage renal disease requiring hemodialysis.  2.  Hypertension.  3.  Diabetes.  4.  Morbid obesity.   POSTOPERATIVE DIAGNOSES:  1.  End-stage renal disease requiring hemodialysis.  2.  Hypertension.  3.  Diabetes.  4.  Morbid obesity.  5.  Hypoxia.    PROCEDURE PERFORMED:  Left brachiocephalic fistula.   SURGEON: Katha Cabal, MD.   ANESTHESIA: General by LMA.   FLUIDS: Per anesthesia record.   ESTIMATED BLOOD LOSS: Minimal.   SPECIMEN: None.   INDICATIONS: Ms. [name] is a 74 year old woman who presented for surgery, creation of a left brachiocephalic fistula. Risks and benefits have been reviewed in the office and the patient has agreed to proceed.   DESCRIPTION OF PROCEDURE: The patient is taken to the operating room and placed in the supine position. After adequate general anesthesia is induced and appropriate invasive monitors are placed she is positioned supine and her left arm is prepped and draped in a circumferential fashion. Appropriate timeout is called.   0.25% Marcaine without epinephrine is then infiltrated in the soft tissues and a curvilinear incision is made across the antecubital fossa. Dissection is carried down to expose the cephalic vein which is skeletonized for a distance of approximately 3-3.5 cm. It is marked with a surgical marker. Small venous tributaries are ligated with 6-0 Prolene and/or 3-0 silk. The vein is then ligated distally with a 3-0 silk, divided, it is then dilated with Donna Christen coronary dilators up to 4.5 mm and flushed with heparinized saline. Bulldog clamp is placed proximally.   The brachial artery is then exposed through the more medial aspect of the incision and looped proximally and distally with Silastic vessel loops. It is then delivered into the surgical field and controlled with the vessel  loops. Arteriotomy is made with an 11 blade, extended with Potts scissors, 6-0 Prolene stay sutures are placed and an end vein to side brachial artery anastomosis is fashioned using running 6-0 Prolene. Flushing maneuvers are performed and flow is established first to the fistula and then distally to the hand. A 2 + radial pulse is maintained, excellent thrill is noted in the fistula. Single bleeding point along the suture line is noted and this is controlled with an interrupted 6-0 Prolene.   The wound is then irrigated with sterile saline, inspected for hemostasis, and closed in layers using interrupted 3-0 Vicryl followed by 4-0 Monocryl subcuticular and then Dermabond. The patient tolerated the procedure well. There were no immediate complications. Sponge and needle counts were correct. She is taken to the recovery area in excellent condition.    ____________________________ Katha Cabal, MD ggs:bu D: 09/13/2014 14:47:07 ET T: 09/13/2014 18:52:15 ET JOB#: [id number]  cc: Katha Cabal, MD, <Dictator> Katha Cabal MD ELECTRONICALLY SIGNED 09/19/2014 13:01
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
## 2015-02-18 NOTE — Consult Note (Signed)
PATIENT NAME:  Jeanette Hester, Jeanette Hester MR#:  [id number] DATE OF BIRTH:  1941/09/08  DATE OF CONSULTATION:  10/26/2014  REFERRING PHYSICIAN:  Nicholes Mango, MD  CONSULTING PHYSICIAN:  Isaias Cowman, MD  CHIEF COMPLAINT: Shortness of breath.   REASON FOR CONSULTATION: Consultation requested for evaluation of congestive heart failure.   HISTORY OF PRESENT ILLNESS: The patient is a 74 year old female, a resident at [facility] who presented to [facility] Emergency Room with shortness of breath. The patient recently was admitted with pneumonia. Today, the patient was noted to be short of breath with tachypnea and diaphoresis.   In the Emergency Room, the patient was noted to be hypoxic and was placed on BiPAP. She was given furosemide 40 mg IV, admitted to the CCU. The patient has chronic kidney disease with left upper extremity AV fistula pending hemodialysis in the near future.   Admission labs were notable for a BUN and creatinine of 44 and 2.4, respectively; troponin was less than 0.02. The patient was also noted to be anemic with a hemoglobin and hematocrit of 8.8 and 27.6, respectively.   PAST MEDICAL HISTORY: 1.  Chronic kidney disease with pending hemodialysis.  2.  History of TIA.  3.  Diabetes.  4.  Diabetic neuropathy.  5.  Hyperlipidemia.  6.  Iron deficiency anemia.   MEDICATIONS: Aspirin 81 mg daily, clopidogrel 75 mg daily, Demadex 20 mg daily, calcium 2 tablets b.i.d., cyanocobalamin 1000 mcg daily, Colace 100 mg b.i.d., Cymbalta 1 daily, Flovent 2 inhalations b.i.d., gabapentin 100 mg t.i.d., Lantus 55 units subcutaneous daily, levothyroxine 1 tablet daily, Nexium 40 mg 1 daily, NovoLog FlexPen 12 units daily, oxycodone p.r.n., Primidone 75 mg b.i.d., Senokot 2 tabs b.i.d., Januvia 50 mg daily, Kayexalate 15 mg as directed.   SOCIAL HISTORY: The patient currently is a resident at [facility], she has no history of tobacco abuse.   FAMILY HISTORY: No immediate family history of  coronary artery disease or myocardial infarction.   REVIEW OF SYSTEMS: Difficult to obtain, the patient currently is on BiPAP.   PHYSICAL EXAMINATION: VITAL SIGNS: Blood pressure 119/82, pulse 67, respirations 18, temperature 97.6, pulse oximetry 100%.  HEENT: The patient currently on BiPAP.  NECK: Supple without thyromegaly.  LUNGS: Revealed decreased breath sounds.  CARDIOVASCULAR: Normal JVP, normal PMI, regular rate and rhythm. Normal S1, S2. No appreciable gallop, murmur, or rub.  ABDOMEN: Soft and nontender. Pulses were intact bilaterally.  MUSCULOSKELETAL: Normal muscle tone.  NEUROLOGIC: The patient appeared alert and oriented, currently on BiPAP and appears to responding to questions appropriately.   IMPRESSION: A 74 year old female with chronic kidney disease, impending end-stage renal disease with hemodialysis, with recent hospitalization for respiratory failure, pneumonia, who returns with recurrent respiratory failure, probable element of congestive heart failure with nondiagnostic ECG and negative troponin.   RECOMMENDATIONS: 1.  Agree with overall current therapy.  2.  Continue diuresis.  3.  Review 2D echocardiogram.  4.  Further recommendations pending echocardiogram results.    ____________________________ Isaias Cowman, MD ap:nt D: 10/26/2014 16:45:32 ET T: 10/26/2014 17:03:38 ET JOB#: [id number]  cc: Isaias Cowman, MD, <Dictator> Isaias Cowman MD ELECTRONICALLY SIGNED 11/08/2014 18:06

## 2015-02-18 NOTE — Consult Note (Signed)
PATIENT NAME:  Jeanette Hester, Jeanette Hester MR#:  [id number] DATE OF BIRTH:  October 26, 1940  DATE OF CONSULTATION:  10/27/2014  REFERRING PHYSICIAN:  Dr. Posey Pronto CONSULTING PHYSICIAN:  Cheral Marker. Ola Spurr, MD  REASON FOR CONSULTATION: UTI, pneumonia and multidrug allergies.   HISTORY OF PRESENT ILLNESS: This is a 74 year old female who is a patient at [facility] she was admitted on January 7th with increasing shortness of breath, tachypnea and diaphoresis. She has a history of chronic kidney disease, diabetes and diabetic nephropathy as well. The patient was admitted to the intensive care unit and has been treated with some diuresis and clinically is improving some. We are consulted for possible pneumonia, UTI.   The patient reports that she chronically has a cough. She does have some sputum production. She was admitted in the last several months with a ammonia. She has not been having any fevers, chills, or night sweats. She is relatively immobile, but gets around in an Transport planner at [facility].   PAST MEDICAL HISTORY: Chronic kidney disease, insulin-dependent diabetes, diabetic peripheral neuropathy, hyperlipidemia, hypothyroidism, spinal stenosis, chronic kidney disease with AV fistula in place, iron deficiency anemia, GERD, chronic back pain, hypothyroidism, history of colon cancer status post surgery, osteoarthritis, TIA, depression, anxiety.   PAST SURGICAL HISTORY: AV fistula in left upper extremity, left leg surgery, colon resection for colon cancer, tonsillectomy, hysterectomy, appendectomy, cardiac surgery.   SOCIAL HISTORY: Lives at [facility]. Used to smoke but no longer. No history of alcohol or drugs.   FAMILY HISTORY Noncontributory.   REVIEW OF SYSTEMS: Eleven systems reviewed and negative except as per HPI.   ANTIBIOTICS SINCE ADMISSION: Include ertapenem, given January 6th.   ALLERGIES: The patient is listed as being allergic to PENICILLIN WHICH CAUSES HIVES, SULFA DRUGS  WHICH CAUSE HIVES, BIAXIN, LEVOFLOXACIN, CIPROFLOXACIN, AUGMENTIN AND AZITHROMYCIN.   PHYSICAL EXAMINATION: VITAL SIGNS: Temperature 99.4, pulse 70, blood pressure 146/42, respirations 17, sat 100% on 2 liters. She has been afebrile since admission. GENERAL: She is morbidly obese, lying in bed, in no acute distress, on oxygen, receiving a blood transfusion.  HEENT: Pupils reactive. Oropharynx clear.  NECK: Supple.  HEART: Regular with 2/6 systolic murmur.  LUNGS: Clear but somewhat distant.  ABDOMEN: Obese, soft, nontender, nondistended.  EXTREMITIES: She has 1+ edema of bilateral lower extremities.  GENITOURINARY: She has a Foley catheter in place with clear yellow urine.   DIAGNOSTIC DATA: White blood count on admission 6.1, hemoglobin 8.8 down to 6.9, platelets 140,000, normal diff.   Blood cultures 2 of 2, January 7th, negative. Urine culture with 104 white cells, on January 7th.   Renal function shows a creatinine of 2.18, BUN 44. Liver tests show albumin low at 1.7, bilirubin 0.1, otherwise normal.   Imaging: Chest x-ray showed pulmonary edema, left-sided pleural effusion, left base consolidation.   Follow-up chest x-ray after thoracentesis shows left pleural effusion has essentially resolved. There is prominent interstitial and vascular markings.  The thoracentesis obtained 800 mL of clear yellow fluid.   IMPRESSION: A pleasant 74 year old female with history of multiple medical problems admitted with congestive heart failure exacerbation and pleural fluid. She has been diuresed and is clinically improving. She does not have a white count or a fever. She has multiple drug allergies, but seems to be tolerating ertapenem.   At this point, I am not convinced she has any pneumonia. Her white count is normal and has no fever. Chest x-ray after the thoracentesis does not show impressive infiltrate. Blood cultures are negative.  RECOMMENDATIONS: 1.  Repeat a urinalysis and urine  culture since none was done on admission for the urine culture.  2.  Continue ertapenem for 3 days total. If urinalysis and urine culture are negative, I would discontinue that.  3.  Can monitor off antibiotics for any further worsening.   Thank you for the consult. I will be glad to follow with you.   ____________________________ Cheral Marker. Ola Spurr, MD [id number] D: 10/27/2014 14:24:17 ET T: 10/27/2014 14:50:11 ET JOB#: [id number]  cc: Cheral Marker. Ola Spurr, MD, <Dictator> Jazyiah Yiu Ola Spurr MD ELECTRONICALLY SIGNED 11/01/2014 15:41

## 2015-02-18 NOTE — Discharge Summary (Signed)
PATIENT NAME:  Jeanette Hester, Jeanette Hester MR#:  [id number] DATE OF BIRTH:  11/15/1940  DATE OF ADMISSION:  10/26/2014 DATE OF DISCHARGE:  11/02/2014  DISPOSITION: To [facility].   CODE STATUS: DNR.  DISCHARGE DIAGNOSES: 1. Acute respiratory failure, acute diastolic heart failure, chronic myoclonic jerks .  2. Hypothyroidism.  3. History of transient ischemic attacks.  4. Generalized deconditioning.  5. 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   DISCHARGE MEDICATIONS: Vitamin B12 1000 mcg p.o. daily, Zyrtec 10 mg p.o. daily, Colace 100 mg p.o. b.i.d. Synthroid 112 mcg p.o. daily, calcium with vitamin D 1 tablet p.o. b.i.d., Systane 1 drop in the left eye every 6 hours as needed. Plavix 75 mg p.o. daily, aspirin 81 mg daily, Cymbalta 30 mg p.o. daily, Neurontin 100 mg p.o. t.i.d. fluticasone nasal spray 50 mcg 2 sprays once a day, Mysoline 50 mg 1-1/2 tablets b.i.d., Nexium 40 mg p.o. daily, Lovaza  1 gram p.o. daily, vitamin C 500 mg p.o. daily, guaifenesin 10 mL every 4 hours as needed for cough, senna as needed for constipation, amlodipine 5 mg once a day, Advair Diskus 250/50 one puff b.i.d., Spiriva 18 mcg inhalation daily. Albuterol nebulizer every 6 hours as needed for wheezing, insulin aspart 6 units t.i.d. before meals, Lantus 50 units at bedtime.   The patient did stop the Januvia and Demadex.   DIET: Low-sodium, low-fat, ADA diet.   CONSULTATIONS: Nephrology consult with Dr. Anthonette Legato; palliative care consult with Dr. Ermalinda Memos. The patient also followed by inpatient  glycemic team.    HOSPITAL COURSE:  9. A 74 year old female with multiple medical problems admitted because of acute on chronic respiratory failure. The patient has shortness of breath and cough, and also diaphoresis in the nursing home. Because of that, she was seen in the ER, admitted because of heart failure. She was given BiPAP and also started on IV Lasix.  BNP was 9945, and the patient's potassium was 5.4. The patient's does have a left arm AV fistula, but never started on dialysis, but she is close to ESRD. The patient came off the BiPAP slowly. They continued IV Lasix. She had a tremendous improvement her with the Lasix. The patient had an ultrasound-guided thoracocentesis and drained about 800 mL of fluid from the left flank. The patient titrated down to the use of nasal cannula. Moved from intensive care unit to regular floor. The patient is doing overall better since stent, and the patient has been seen by Dr. Holley Raring. The patient's thoracentesis did not reveal any growth. The patient's echocardiogram showed an EF of 50% to 55% with normal LV systolic function.  Right now, she is on Lasix, and the patient lost about 4 pounds of weight here, and then had diuresed about 12 liters of fluid. The patient's condition is stable, and she will follow up with Dr. Holley Raring  as an outpatient. She does have creatinine of  around 2.2, which is stable. .  2. Acute respiratory failure with congestive heart failure seen by congestive heart failure clinic and the patient has an appointment with the heart failure clinic on January 25th at 9:00 a.m. The patient also seen by Dr. Mortimer Fries for her respiratory failure. The patient has chronic obstructive pulmonary disease exacerbation with wheezing. She was on BiPAP, then weaned off. The  patient is an Spiriva and nebulizers. The patient is on oxygen, continue to keep saturations around 90%.  3. The patient's other diagnoses include diabetes  mellitus type 2. The patient had sugars of like  200s to 300s. We up titrated  the Lantus. She also was seen by inpatient diabetic team. She got Lantus 60 units last night and sugars were 35 this morning, so I decreased the Lantus back to 50 units, and she is on NovoLog t.i.d. The patient will follow up with the primary doctor in about a week to 2 weeks..  4. The patient's other diagnoses includes a  history of transient ischemic attacks before. She is on Plavix.  5. History of myoclonic jerks. Continue Mysoline.  6. Hypothyroidism. Continue Synthroid.  7. Constipation. She did receive a lot of stool softeners and then had a lot of BMs yesterday and today. I told the patient that she can take stool softeners as needed, so please make sure that she gets stool softeners as needed only, and do not get om scheduled basis.  8. The patient has anemia of chronic kidney disease. The patient has received 1 transfusion.   DISPOSITION: The patient  will be going to [facility].   TIME SPENT: More than 30 minutes.     ____________________________ Epifanio Lesches, MD sk:[id number] D: 11/02/2014 12:33:17 ET T: 11/02/2014 13:03:35 ET JOB#: [id number]  cc: Epifanio Lesches, MD, <Dictator> Munsoor Lilian Kapur, MD  Epifanio Lesches MD ELECTRONICALLY SIGNED 11/21/2014 12:05

## 2015-02-18 DEATH — deceased
